# Patient Record
Sex: Female | Born: 1985 | Race: White | Hispanic: No | Marital: Single | Smoking: Never smoker
Health system: Southern US, Community
[De-identification: ages and names within clinical notes are randomized; demographics above are authoritative.]

## PROBLEM LIST (undated history)

## (undated) ENCOUNTER — Inpatient Hospital Stay (HOSPITAL_COMMUNITY): Payer: Self-pay

## (undated) DIAGNOSIS — G8929 Other chronic pain: Secondary | ICD-10-CM

## (undated) DIAGNOSIS — M5136 Other intervertebral disc degeneration, lumbar region: Secondary | ICD-10-CM

## (undated) DIAGNOSIS — M545 Low back pain, unspecified: Secondary | ICD-10-CM

## (undated) DIAGNOSIS — R519 Headache, unspecified: Secondary | ICD-10-CM

## (undated) DIAGNOSIS — M51369 Other intervertebral disc degeneration, lumbar region without mention of lumbar back pain or lower extremity pain: Secondary | ICD-10-CM

## (undated) DIAGNOSIS — E559 Vitamin D deficiency, unspecified: Secondary | ICD-10-CM

## (undated) DIAGNOSIS — E7800 Pure hypercholesterolemia, unspecified: Secondary | ICD-10-CM

## (undated) DIAGNOSIS — F419 Anxiety disorder, unspecified: Secondary | ICD-10-CM

## (undated) DIAGNOSIS — R51 Headache: Secondary | ICD-10-CM

## (undated) DIAGNOSIS — E669 Obesity, unspecified: Secondary | ICD-10-CM

## (undated) DIAGNOSIS — M129 Arthropathy, unspecified: Secondary | ICD-10-CM

## (undated) DIAGNOSIS — E282 Polycystic ovarian syndrome: Secondary | ICD-10-CM

## (undated) DIAGNOSIS — I10 Essential (primary) hypertension: Secondary | ICD-10-CM

## (undated) HISTORY — DX: Other intervertebral disc degeneration, lumbar region without mention of lumbar back pain or lower extremity pain: M51.369

## (undated) HISTORY — PX: NOSE SURGERY: SHX723

## (undated) HISTORY — DX: Arthropathy, unspecified: M12.9

## (undated) HISTORY — DX: Other intervertebral disc degeneration, lumbar region: M51.36

## (undated) HISTORY — DX: Anxiety disorder, unspecified: F41.9

## (undated) HISTORY — DX: Obesity, unspecified: E66.9

## (undated) HISTORY — DX: Polycystic ovarian syndrome: E28.2

## (undated) HISTORY — PX: ARTHROSCOPIC REPAIR ACL: SUR80

## (undated) HISTORY — DX: Vitamin D deficiency, unspecified: E55.9

## (undated) HISTORY — DX: Low back pain, unspecified: M54.50

## (undated) HISTORY — PX: KNEE SURGERY: SHX244

## (undated) HISTORY — PX: WISDOM TOOTH EXTRACTION: SHX21

## (undated) HISTORY — DX: Pure hypercholesterolemia, unspecified: E78.00

## (undated) HISTORY — PX: TUBAL LIGATION: SHX77

## (undated) HISTORY — DX: Other chronic pain: G89.29

---

## 2010-06-18 ENCOUNTER — Emergency Department (HOSPITAL_COMMUNITY)
Admission: EM | Admit: 2010-06-18 | Discharge: 2010-06-18 | Disposition: A | Discharge status: Home / Self Care | Payer: Medicaid Other | Attending: Emergency Medicine | Admitting: Emergency Medicine

## 2010-06-18 DIAGNOSIS — R209 Unspecified disturbances of skin sensation: Secondary | ICD-10-CM | POA: Insufficient documentation

## 2012-07-23 ENCOUNTER — Ambulatory Visit: Payer: Self-pay | Admitting: General Practice

## 2012-08-06 ENCOUNTER — Ambulatory Visit: Payer: Self-pay | Admitting: General Practice

## 2015-01-26 LAB — OB RESULTS CONSOLE HGB/HCT, BLOOD
HCT: 38 %
HEMOGLOBIN: 13 g/dL

## 2015-01-26 LAB — OB RESULTS CONSOLE RPR: RPR: NONREACTIVE

## 2015-01-26 LAB — OB RESULTS CONSOLE HEPATITIS B SURFACE ANTIGEN: HEP B S AG: NEGATIVE

## 2015-01-26 LAB — OB RESULTS CONSOLE GC/CHLAMYDIA
Chlamydia: NEGATIVE
Gonorrhea: NEGATIVE

## 2015-01-26 LAB — OB RESULTS CONSOLE ANTIBODY SCREEN: ANTIBODY SCREEN: NEGATIVE

## 2015-01-26 LAB — OB RESULTS CONSOLE ABO/RH: RH TYPE: POSITIVE

## 2015-01-26 LAB — OB RESULTS CONSOLE RUBELLA ANTIBODY, IGM: Rubella: IMMUNE

## 2015-01-26 LAB — OB RESULTS CONSOLE HIV ANTIBODY (ROUTINE TESTING): HIV: NONREACTIVE

## 2015-01-26 LAB — OB RESULTS CONSOLE TB SKIN TEST: CHL TB SkinTest: NEGATIVE

## 2015-01-27 DIAGNOSIS — Z302 Encounter for sterilization: Secondary | ICD-10-CM | POA: Insufficient documentation

## 2015-01-27 DIAGNOSIS — IMO0002 Reserved for concepts with insufficient information to code with codable children: Secondary | ICD-10-CM | POA: Insufficient documentation

## 2015-03-01 ENCOUNTER — Encounter: Payer: Self-pay | Admitting: *Deleted

## 2015-03-01 ENCOUNTER — Encounter: Payer: Self-pay | Admitting: Advanced Practice Midwife

## 2015-03-01 ENCOUNTER — Ambulatory Visit (INDEPENDENT_AMBULATORY_CARE_PROVIDER_SITE_OTHER): Payer: Medicaid Other | Admitting: Advanced Practice Midwife

## 2015-03-01 VITALS — BP 126/84 | HR 88 | Ht 66.0 in | Wt 224.0 lb

## 2015-03-01 DIAGNOSIS — Z3491 Encounter for supervision of normal pregnancy, unspecified, first trimester: Secondary | ICD-10-CM

## 2015-03-01 DIAGNOSIS — O0990 Supervision of high risk pregnancy, unspecified, unspecified trimester: Secondary | ICD-10-CM | POA: Insufficient documentation

## 2015-03-01 DIAGNOSIS — Z3490 Encounter for supervision of normal pregnancy, unspecified, unspecified trimester: Secondary | ICD-10-CM

## 2015-03-01 DIAGNOSIS — O34219 Maternal care for unspecified type scar from previous cesarean delivery: Secondary | ICD-10-CM | POA: Insufficient documentation

## 2015-03-01 DIAGNOSIS — Z3481 Encounter for supervision of other normal pregnancy, first trimester: Secondary | ICD-10-CM

## 2015-03-01 LAB — PAP, THINPREP RFLX HPV
HPV DNA: NOT DETECTED
Pap: NEGATIVE

## 2015-03-01 NOTE — Patient Instructions (Signed)
First Trimester of Pregnancy The first trimester of pregnancy is from week 1 until the end of week 12 (months 1 through 3). A week after a sperm fertilizes an egg, the egg will implant on the wall of the uterus. This embryo will begin to develop into a baby. Genes from you and your partner are forming the baby. The female genes determine whether the baby is a boy or a girl. At 6-8 weeks, the eyes and face are formed, and the heartbeat can be seen on ultrasound. At the end of 12 weeks, all the baby's organs are formed.  Now that you are pregnant, you will want to do everything you can to have a healthy baby. Two of the most important things are to get good prenatal care and to follow your health care provider's instructions. Prenatal care is all the medical care you receive before the baby's birth. This care will help prevent, find, and treat any problems during the pregnancy and childbirth. BODY CHANGES Your body goes through many changes during pregnancy. The changes vary from woman to woman.   You may gain or lose a couple of pounds at first.  You may feel sick to your stomach (nauseous) and throw up (vomit). If the vomiting is uncontrollable, call your health care provider.  You may tire easily.  You may develop headaches that can be relieved by medicines approved by your health care provider.  You may urinate more often. Painful urination may mean you have a bladder infection.  You may develop heartburn as a result of your pregnancy.  You may develop constipation because certain hormones are causing the muscles that push waste through your intestines to slow down.  You may develop hemorrhoids or swollen, bulging veins (varicose veins).  Your breasts may begin to grow larger and become tender. Your nipples may stick out more, and the tissue that surrounds them (areola) may become darker.  Your gums may bleed and may be sensitive to brushing and flossing.  Dark spots or blotches (chloasma,  mask of pregnancy) may develop on your face. This will likely fade after the baby is born.  Your menstrual periods will stop.  You may have a loss of appetite.  You may develop cravings for certain kinds of food.  You may have changes in your emotions from day to day, such as being excited to be pregnant or being concerned that something may go wrong with the pregnancy and baby.  You may have more vivid and strange dreams.  You may have changes in your hair. These can include thickening of your hair, rapid growth, and changes in texture. Some women also have hair loss during or after pregnancy, or hair that feels dry or thin. Your hair will most likely return to normal after your baby is born. WHAT TO EXPECT AT YOUR PRENATAL VISITS During a routine prenatal visit:  You will be weighed to make sure you and the baby are growing normally.  Your blood pressure will be taken.  Your abdomen will be measured to track your baby's growth.  The fetal heartbeat will be listened to starting around week 10 or 12 of your pregnancy.  Test results from any previous visits will be discussed. Your health care provider may ask you:  How you are feeling.  If you are feeling the baby move.  If you have had any abnormal symptoms, such as leaking fluid, bleeding, severe headaches, or abdominal cramping.  If you are using any tobacco products,   including cigarettes, chewing tobacco, and electronic cigarettes.  If you have any questions. Other tests that may be performed during your first trimester include:  Blood tests to find your blood type and to check for the presence of any previous infections. They will also be used to check for low iron levels (anemia) and Rh antibodies. Later in the pregnancy, blood tests for diabetes will be done along with other tests if problems develop.  Urine tests to check for infections, diabetes, or protein in the urine.  An ultrasound to confirm the proper growth  and development of the baby.  An amniocentesis to check for possible genetic problems.  Fetal screens for spina bifida and Down syndrome.  You may need other tests to make sure you and the baby are doing well.  HIV (human immunodeficiency virus) testing. Routine prenatal testing includes screening for HIV, unless you choose not to have this test. HOME CARE INSTRUCTIONS  Medicines  Follow your health care provider's instructions regarding medicine use. Specific medicines may be either safe or unsafe to take during pregnancy.  Take your prenatal vitamins as directed.  If you develop constipation, try taking a stool softener if your health care provider approves. Diet  Eat regular, well-balanced meals. Choose a variety of foods, such as meat or vegetable-based protein, fish, milk and low-fat dairy products, vegetables, fruits, and whole grain breads and cereals. Your health care provider will help you determine the amount of weight gain that is right for you.  Avoid raw meat and uncooked cheese. These carry germs that can cause birth defects in the baby.  Eating four or five small meals rather than three large meals a day may help relieve nausea and vomiting. If you start to feel nauseous, eating a few soda crackers can be helpful. Drinking liquids between meals instead of during meals also seems to help nausea and vomiting.  If you develop constipation, eat more high-fiber foods, such as fresh vegetables or fruit and whole grains. Drink enough fluids to keep your urine clear or pale yellow. Activity and Exercise  Exercise only as directed by your health care provider. Exercising will help you:  Control your weight.  Stay in shape.  Be prepared for labor and delivery.  Experiencing pain or cramping in the lower abdomen or low back is a good sign that you should stop exercising. Check with your health care provider before continuing normal exercises.  Try to avoid standing for long  periods of time. Move your legs often if you must stand in one place for a long time.  Avoid heavy lifting.  Wear low-heeled shoes, and practice good posture.  You may continue to have sex unless your health care provider directs you otherwise. Relief of Pain or Discomfort  Wear a good support bra for breast tenderness.   Take warm sitz baths to soothe any pain or discomfort caused by hemorrhoids. Use hemorrhoid cream if your health care provider approves.   Rest with your legs elevated if you have leg cramps or low back pain.  If you develop varicose veins in your legs, wear support hose. Elevate your feet for 15 minutes, 3-4 times a day. Limit salt in your diet. Prenatal Care  Schedule your prenatal visits by the twelfth week of pregnancy. They are usually scheduled monthly at first, then more often in the last 2 months before delivery.  Write down your questions. Take them to your prenatal visits.  Keep all your prenatal visits as directed by your   health care provider. Safety  Wear your seat belt at all times when driving.  Make a list of emergency phone numbers, including numbers for family, friends, the hospital, and police and fire departments. General Tips  Ask your health care provider for a referral to a local prenatal education class. Begin classes no later than at the beginning of month 6 of your pregnancy.  Ask for help if you have counseling or nutritional needs during pregnancy. Your health care provider can offer advice or refer you to specialists for help with various needs.  Do not use hot tubs, steam rooms, or saunas.  Do not douche or use tampons or scented sanitary pads.  Do not cross your legs for long periods of time.  Avoid cat litter boxes and soil used by cats. These carry germs that can cause birth defects in the baby and possibly loss of the fetus by miscarriage or stillbirth.  Avoid all smoking, herbs, alcohol, and medicines not prescribed by  your health care provider. Chemicals in these affect the formation and growth of the baby.  Do not use any tobacco products, including cigarettes, chewing tobacco, and electronic cigarettes. If you need help quitting, ask your health care provider. You may receive counseling support and other resources to help you quit.  Schedule a dentist appointment. At home, brush your teeth with a soft toothbrush and be gentle when you floss. SEEK MEDICAL CARE IF:   You have dizziness.  You have mild pelvic cramps, pelvic pressure, or nagging pain in the abdominal area.  You have persistent nausea, vomiting, or diarrhea.  You have a bad smelling vaginal discharge.  You have pain with urination.  You notice increased swelling in your face, hands, legs, or ankles. SEEK IMMEDIATE MEDICAL CARE IF:   You have a fever.  You are leaking fluid from your vagina.  You have spotting or bleeding from your vagina.  You have severe abdominal cramping or pain.  You have rapid weight gain or loss.  You vomit blood or material that looks like coffee grounds.  You are exposed to German measles and have never had them.  You are exposed to fifth disease or chickenpox.  You develop a severe headache.  You have shortness of breath.  You have any kind of trauma, such as from a fall or a car accident.   This information is not intended to replace advice given to you by your health care provider. Make sure you discuss any questions you have with your health care provider.   Document Released: 12/13/2000 Document Revised: 01/09/2014 Document Reviewed: 10/29/2012 Elsevier Interactive Patient Education 2016 [employer]  

## 2015-03-01 NOTE — Progress Notes (Signed)
New OB, see SmartSet  Subjective:    Sandra Fuller is a G3P1011 [redacted]w[redacted]d being seen today for her first obstetrical visit.  Her obstetrical history is significant for previous C/S. Patient does intend to breast feed. Pregnancy history fully reviewed.  Patient reports no complaints.  Filed Vitals:   03/01/15 1028 03/01/15 1031  BP: 126/84   Pulse: 88   Height:   (1.676 m)  Weight: 224 lb (101.606 kg)     HISTORY: OB History  Gravida Para Term Preterm AB SAB TAB Ectopic Multiple Living  # Outcome Date GA Lbr Len/2nd Weight Sex Delivery Anes PTL Lv  3 Current           2 Term 10/31/07   6 lb (2.722 kg) F CS-LTranv   Y     Complications: Failure to Progress in First Stage,Two vessel umbilical cord, antepartum, fetus 2  1 SAB              History reviewed. No pertinent past medical history. Past Surgical History  Procedure Laterality Date  . Cesarean section    . Arthroscopic repair acl Bilateral   . Nose surgery      broken nose  . Wisdom tooth extraction     Family History  Problem Relation Age of Onset  . Hypertension Mother   . Hypertension Father   . Heart disease Father   . Cancer - Lung Paternal Grandmother   . Diabetes Mother     gestational     Exam    Uterus:     Pelvic Exam:    Perineum: Exam deferred per pt request, just had one at other practice   Vulva: deferred   Vagina:  deferred   pH:    Cervix: deferred   Adnexa: not evaluated   Bony Pelvis: gynecoid  System: Breast:  normal appearance, no masses or tenderness   Skin: normal coloration and turgor, no rashes    Neurologic: oriented, grossly non-focal   Extremities: normal strength, tone, and muscle mass   HEENT neck supple with midline trachea   Mouth/Teeth mucous membranes moist, pharynx normal without lesions   Neck supple   Cardiovascular: regular rate and rhythm   Respiratory:  appears well, vitals normal, no respiratory distress, acyanotic, normal RR, ear and  throat exam is normal   Abdomen: soft, non-tender; bowel sounds normal; no masses,  no organomegaly   Urinary: n/a      Assessment:    Pregnancy: G3P1011 Patient Active Problem List   Diagnosis Date Noted  . Normal pregnancy, antepartum 03/01/2015  . History of cesarean delivery, currently pregnant 03/01/2015        Plan:     Initial labs drawn. Prenatal vitamins. Problem list reviewed and updated. Genetic Screening discussed Integrated Screen: ordered.  Ultrasound discussed; fetal survey: ordered.  Follow up in 4 weeks. 50% of 30 min visit spent on counseling and coordination of care.   Routines reviewed Welcomed to practice OK with students   [facility] 03/01/2015

## 2015-03-10 ENCOUNTER — Encounter (HOSPITAL_COMMUNITY): Payer: Self-pay | Admitting: Obstetrics & Gynecology

## 2015-03-18 ENCOUNTER — Ambulatory Visit (HOSPITAL_COMMUNITY)
Admission: RE | Admit: 2015-03-18 | Discharge: 2015-03-18 | Disposition: A | Discharge status: Home / Self Care | Payer: Medicaid Other | Source: Ambulatory Visit | Attending: Advanced Practice Midwife | Admitting: Advanced Practice Midwife

## 2015-03-18 ENCOUNTER — Other Ambulatory Visit: Payer: Self-pay | Admitting: Advanced Practice Midwife

## 2015-03-18 ENCOUNTER — Other Ambulatory Visit (HOSPITAL_COMMUNITY): Payer: Medicaid Other

## 2015-03-18 VITALS — BP 125/76 | HR 97 | Wt 224.0 lb

## 2015-03-18 DIAGNOSIS — Z369 Encounter for antenatal screening, unspecified: Secondary | ICD-10-CM

## 2015-03-18 DIAGNOSIS — O34219 Maternal care for unspecified type scar from previous cesarean delivery: Secondary | ICD-10-CM | POA: Diagnosis not present

## 2015-03-18 DIAGNOSIS — Z3490 Encounter for supervision of normal pregnancy, unspecified, unspecified trimester: Secondary | ICD-10-CM

## 2015-03-18 DIAGNOSIS — Z3A13 13 weeks gestation of pregnancy: Secondary | ICD-10-CM | POA: Diagnosis not present

## 2015-03-18 DIAGNOSIS — O99331 Smoking (tobacco) complicating pregnancy, first trimester: Secondary | ICD-10-CM | POA: Diagnosis not present

## 2015-03-18 DIAGNOSIS — Z36 Encounter for antenatal screening of mother: Secondary | ICD-10-CM | POA: Insufficient documentation

## 2015-03-18 DIAGNOSIS — Z3491 Encounter for supervision of normal pregnancy, unspecified, first trimester: Secondary | ICD-10-CM

## 2015-03-24 ENCOUNTER — Encounter: Payer: Self-pay | Admitting: *Deleted

## 2015-03-24 DIAGNOSIS — Z3490 Encounter for supervision of normal pregnancy, unspecified, unspecified trimester: Secondary | ICD-10-CM

## 2015-03-26 ENCOUNTER — Encounter: Payer: Self-pay | Admitting: *Deleted

## 2015-03-26 ENCOUNTER — Other Ambulatory Visit (HOSPITAL_COMMUNITY): Payer: Self-pay

## 2015-03-26 ENCOUNTER — Ambulatory Visit (INDEPENDENT_AMBULATORY_CARE_PROVIDER_SITE_OTHER): Payer: Medicaid Other | Admitting: Advanced Practice Midwife

## 2015-03-26 VITALS — BP 130/84 | HR 98 | Wt 228.0 lb

## 2015-03-26 DIAGNOSIS — Z1389 Encounter for screening for other disorder: Secondary | ICD-10-CM

## 2015-03-26 DIAGNOSIS — Z36 Encounter for antenatal screening of mother: Secondary | ICD-10-CM

## 2015-03-26 DIAGNOSIS — Z3482 Encounter for supervision of other normal pregnancy, second trimester: Secondary | ICD-10-CM

## 2015-03-26 DIAGNOSIS — R519 Headache, unspecified: Secondary | ICD-10-CM

## 2015-03-26 DIAGNOSIS — O26892 Other specified pregnancy related conditions, second trimester: Secondary | ICD-10-CM

## 2015-03-26 DIAGNOSIS — O34219 Maternal care for unspecified type scar from previous cesarean delivery: Secondary | ICD-10-CM

## 2015-03-26 DIAGNOSIS — R51 Headache: Secondary | ICD-10-CM

## 2015-03-26 DIAGNOSIS — Z3490 Encounter for supervision of normal pregnancy, unspecified, unspecified trimester: Secondary | ICD-10-CM

## 2015-03-26 MED ORDER — BUTALBITAL-APAP-CAFFEINE 50-325-40 MG PO CAPS: 1.0000 | ORAL_CAPSULE | Freq: Four times a day (QID) | ORAL | Status: DC | PRN

## 2015-03-26 MED ORDER — IBUPROFEN 600 MG PO TABS: 600.0000 mg | ORAL_TABLET | Freq: Four times a day (QID) | ORAL | Status: DC | PRN

## 2015-03-26 NOTE — Progress Notes (Signed)
Subjective:  Sandra Fuller is a 30 y.o. G3P1011 at 4965w4d being seen today for ongoing prenatal care.  She is currently monitored for the following issues for this low-risk pregnancy and has Normal pregnancy, antepartum and History of cesarean delivery, currently pregnant on her problem list.  Patient reports frontal headache that does not resolve w/ Tylenol, Excedrin or caffeine. Had similar HA's frequently in last pregnancy.  Contractions: Not present. Vag. Bleeding: None.  Movement: Absent. Denies leaking of fluid.   The following portions of the patient's history were reviewed and updated as appropriate: allergies, current medications, past family history, past medical history, past social history, past surgical history and problem list. Problem list updated.  Objective:   Filed Vitals:   03/26/15 0836  BP: 130/84  Pulse: 98  Weight: 228 lb (103.42 kg)    Fetal Status: Fetal Heart Rate (bpm): 165 Fundal Height: 14 cm Movement: Absent     General:  Alert, oriented and cooperative. Patient is in no acute distress.  Skin: Skin is warm and dry. No rash noted.   Cardiovascular: Normal heart rate noted  Respiratory: Normal respiratory effort, no problems with respiration noted  Abdomen: Soft, gravid, appropriate for gestational age. Pain/Pressure: Absent     Pelvic: Vag. Bleeding: None Vag D/C Character: Thin   Cervical exam deferred        Extremities: Normal range of motion.  Edema: None  Mental Status: Normal mood and affect. Normal behavior. Normal judgment and thought content.   Urinalysis: Urine Protein: Trace Urine Glucose: Negative  Assessment and Plan:  Pregnancy: G3P1011 at 6065w4d  1. Normal pregnancy, antepartum  - US MFM OB COMP + 14 WK; Future  2. Encounter for routine screening for malformation using ultrasonics  - US MFM OB COMP + 14 WK; Future  3. Previous cesarean delivery affecting pregnancy, antepartum  - US MFM OB COMP + 14 WK; Future  Preterm labor  symptoms and general obstetric precautions including but not limited to vaginal bleeding, contractions, leaking of fluid and fetal movement were reviewed in detail with the patient. Please refer to After Visit Summary for other counseling recommendations.  Return in about 4 weeks (around 04/23/2015).   Dorathy KinsmanVirginia Gelene Recktenwald, CNM

## 2015-03-26 NOTE — Patient Instructions (Signed)

## 2015-04-01 ENCOUNTER — Encounter: Payer: Medicaid Other | Admitting: Obstetrics & Gynecology

## 2015-04-22 ENCOUNTER — Encounter: Payer: Medicaid Other | Admitting: Obstetrics & Gynecology

## 2015-04-23 ENCOUNTER — Ambulatory Visit (HOSPITAL_COMMUNITY)
Admission: RE | Admit: 2015-04-23 | Discharge: 2015-04-23 | Disposition: A | Discharge status: Home / Self Care | Payer: Medicaid Other | Source: Ambulatory Visit | Attending: Advanced Practice Midwife | Admitting: Advanced Practice Midwife

## 2015-04-23 ENCOUNTER — Other Ambulatory Visit: Payer: Self-pay | Admitting: Advanced Practice Midwife

## 2015-04-23 DIAGNOSIS — Z3A18 18 weeks gestation of pregnancy: Secondary | ICD-10-CM | POA: Diagnosis not present

## 2015-04-23 DIAGNOSIS — Z1389 Encounter for screening for other disorder: Secondary | ICD-10-CM

## 2015-04-23 DIAGNOSIS — O34219 Maternal care for unspecified type scar from previous cesarean delivery: Secondary | ICD-10-CM

## 2015-04-23 DIAGNOSIS — Z3490 Encounter for supervision of normal pregnancy, unspecified, unspecified trimester: Secondary | ICD-10-CM

## 2015-04-23 DIAGNOSIS — Z36 Encounter for antenatal screening of mother: Secondary | ICD-10-CM | POA: Insufficient documentation

## 2015-04-28 ENCOUNTER — Ambulatory Visit (INDEPENDENT_AMBULATORY_CARE_PROVIDER_SITE_OTHER): Payer: Medicaid Other | Admitting: Obstetrics & Gynecology

## 2015-04-28 VITALS — BP 132/82 | HR 99 | Wt 229.0 lb

## 2015-04-28 DIAGNOSIS — Z36 Encounter for antenatal screening of mother: Secondary | ICD-10-CM

## 2015-04-28 DIAGNOSIS — Z3402 Encounter for supervision of normal first pregnancy, second trimester: Secondary | ICD-10-CM

## 2015-04-28 DIAGNOSIS — Z0489 Encounter for examination and observation for other specified reasons: Secondary | ICD-10-CM

## 2015-04-28 DIAGNOSIS — IMO0002 Reserved for concepts with insufficient information to code with codable children: Secondary | ICD-10-CM

## 2015-04-28 DIAGNOSIS — Z3490 Encounter for supervision of normal pregnancy, unspecified, unspecified trimester: Secondary | ICD-10-CM

## 2015-04-28 NOTE — Progress Notes (Signed)
Subjective:  Sandra Fuller is a [age] y.o. G3P1011 at 5982w2d being seen today for ongoing prenatal care.  She is currently monitored for the following issues for this low-risk pregnancy and has Normal pregnancy, antepartum and History of cesarean delivery, currently pregnant on her problem list.  Patient reports tooth ache, needs tooth to be pulled. Wants to know if this safe now, also requests letter to dentist.  Contractions: Not present. Vag. Bleeding: None.  Movement: Absent. Denies leaking of fluid.   The following portions of the patient's history were reviewed and updated as appropriate: allergies, current medications, past family history, past medical history, past social history, past surgical history and problem list. Problem list updated.  Objective:   Filed Vitals:   04/28/15 0945  BP: 132/82  Pulse: 99  Weight: 229 lb (103.874 kg)    Fetal Status: Fetal Heart Rate (bpm): 154 Fundal Height: 19 cm Movement: Absent     General:  Alert, oriented and cooperative. Patient is in no acute distress.  Skin: Skin is warm and dry. No rash noted.   Cardiovascular: Normal heart rate noted  Respiratory: Normal respiratory effort, no problems with respiration noted  Abdomen: Soft, gravid, appropriate for gestational age. Pain/Pressure: Absent     Pelvic: Vag. Bleeding: None Vag D/C Character: Thick  Cervical exam deferred        Extremities: Normal range of motion.  Edema: Trace  Mental Status: Normal mood and affect. Normal behavior. Normal judgment and thought content.   Urinalysis: Urine Protein: Trace Urine Glucose: Negative  Assessment and Plan:  Pregnancy: G3P1011 at 4082w2d  1. Evaluate anatomy not seen on prior sonogram Limited anatomy scan at 18 weeks, rescan at 24 weeks ordered - US MFM OB FOLLOW UP; Future  2. Normal pregnancy, antepartum Normal first screen, AFP screen today - Alpha fetoprotein, maternal Dental letter given; patient reassured about safety of dental  procedures during pregnancy. Routine obstetric precautions reviewed. Please refer to After Visit Summary for other counseling recommendations.  Return in about 4 weeks (around 05/26/2015) for OB Visit.   Tereso NewcomerUgonna A Anyanwu, MD

## 2015-04-28 NOTE — Patient Instructions (Signed)
Return to clinic for any scheduled appointments or obstetric concerns, or go to MAU for evaluation  

## 2015-04-29 ENCOUNTER — Encounter: Payer: Medicaid Other | Admitting: Obstetrics & Gynecology

## 2015-04-29 LAB — ALPHA FETOPROTEIN, MATERNAL
AFP: 64.8 ng/mL
Curr Gest Age: 19.3 weeks
MoM for AFP: 1.64
Open Spina bifida: NEGATIVE
Osb Risk: 1:1870 {titer}

## 2015-05-02 DIAGNOSIS — O09899 Supervision of other high risk pregnancies, unspecified trimester: Secondary | ICD-10-CM | POA: Insufficient documentation

## 2015-05-08 ENCOUNTER — Inpatient Hospital Stay (HOSPITAL_COMMUNITY)
Admission: AD | Admit: 2015-05-08 | Discharge: 2015-05-08 | Disposition: A | Discharge status: Home / Self Care | Payer: Medicaid Other | Source: Ambulatory Visit | Attending: Obstetrics & Gynecology | Admitting: Obstetrics & Gynecology

## 2015-05-08 ENCOUNTER — Encounter (HOSPITAL_COMMUNITY): Payer: Self-pay | Admitting: *Deleted

## 2015-05-08 DIAGNOSIS — O99352 Diseases of the nervous system complicating pregnancy, second trimester: Secondary | ICD-10-CM | POA: Diagnosis not present

## 2015-05-08 DIAGNOSIS — M549 Dorsalgia, unspecified: Secondary | ICD-10-CM | POA: Diagnosis present

## 2015-05-08 DIAGNOSIS — O9989 Other specified diseases and conditions complicating pregnancy, childbirth and the puerperium: Secondary | ICD-10-CM

## 2015-05-08 DIAGNOSIS — O26892 Other specified pregnancy related conditions, second trimester: Secondary | ICD-10-CM

## 2015-05-08 DIAGNOSIS — Z3490 Encounter for supervision of normal pregnancy, unspecified, unspecified trimester: Secondary | ICD-10-CM

## 2015-05-08 DIAGNOSIS — Z3A20 20 weeks gestation of pregnancy: Secondary | ICD-10-CM | POA: Insufficient documentation

## 2015-05-08 DIAGNOSIS — G43909 Migraine, unspecified, not intractable, without status migrainosus: Secondary | ICD-10-CM | POA: Diagnosis not present

## 2015-05-08 HISTORY — DX: Headache, unspecified: R51.9

## 2015-05-08 HISTORY — DX: Headache: R51

## 2015-05-08 LAB — URINALYSIS, ROUTINE W REFLEX MICROSCOPIC
Bilirubin Urine: NEGATIVE
Glucose, UA: NEGATIVE mg/dL
HGB URINE DIPSTICK: NEGATIVE
Ketones, ur: 15 mg/dL — AB
LEUKOCYTES UA: NEGATIVE
NITRITE: NEGATIVE
PROTEIN: NEGATIVE mg/dL
Specific Gravity, Urine: 1.02 (ref 1.005–1.030)
pH: 6 (ref 5.0–8.0)

## 2015-05-08 MED ORDER — CYCLOBENZAPRINE HCL 10 MG PO TABS
10.0000 mg | ORAL_TABLET | Freq: Once | ORAL | Status: AC
  Administered 2015-05-08: 10 mg via ORAL
  Filled 2015-05-08: qty 1

## 2015-05-08 NOTE — MAU Provider Note (Signed)
History     CSN: [id number]  Arrival date and time: 05/08/15 4098   First Provider Initiated Contact with Patient 05/08/15 1903      Chief Complaint  Patient presents with  . Back Pain  . Decreased Fetal Movement   HPI .Sandra Fuller 29 y.o. [redacted]w[redacted]d Comes to MAU with back pain just below left scapula and has not felt the baby move since 11 am.  Called and was told she probably had decreased fetal movement.  Has had no vaginal bleeding and no leaking.  Lifted a large lab this week and wonders if that is causing her back pain.  Has taken tylenol but it has not completely resolved.  Last pregnancy was 8 years ago and she does not remember what is normal.  OB History    Gravida Para Term Preterm AB TAB SAB Ectopic Multiple Living   Past Medical History  Diagnosis Date  . Headache     Migraines    Past Surgical History  Procedure Laterality Date  . Cesarean section    . Arthroscopic repair acl Bilateral   . Nose surgery      broken nose  . Wisdom tooth extraction      Family History  Problem Relation Age of Onset  . Hypertension Mother   . Hypertension Father   . Heart disease Father   . Cancer - Lung Paternal Grandmother   . Diabetes Mother     gestational    Social History  Substance Use Topics  . Smoking status: Never Smoker   . Smokeless tobacco: Never Used  . Alcohol Use: 0.0 oz/week    0 Standard drinks or equivalent per week     Comment: occassionally    Allergies: No Known Allergies  Prescriptions prior to admission  Medication Sig Dispense Refill Last Dose  . acetaminophen (TYLENOL) 325 MG tablet Take by mouth.   Taking  . Butalbital-APAP-Caffeine 50-325-40 MG capsule Take 1-2 capsules by mouth every 6 (six) hours as needed for headache. 30 capsule 1   . ibuprofen (ADVIL,MOTRIN) 600 MG tablet Take 1 tablet (600 mg total) by mouth every 6 (six) hours as needed for moderate pain. Do not use after [redacted] weeks gestation. 30 tablet 1    . ondansetron (ZOFRAN) 4 MG tablet Take by mouth.   Taking  . Prenatal Vit-Fe Fumarate-FA (PRENATAL VITAMINS) 28-0.8 MG TABS Take by mouth.   Taking    Review of Systems  Constitutional: Negative for fever.  Gastrointestinal: Negative for nausea, vomiting and abdominal pain.  Genitourinary:       No vaginal discharge. No vaginal bleeding. No dysuria.  Musculoskeletal: Positive for back pain.   Physical Exam   Blood pressure 126/77, pulse 105, temperature 98 F (36.7 C), temperature source Axillary, resp. rate 16, last menstrual period 12/14/2014.  Physical Exam  Nursing note and vitals reviewed. Constitutional: She is oriented to person, place, and time. She appears well-developed and well-nourished.  HENT:  Head: Normocephalic.  Eyes: EOM are normal.  Neck: Neck supple.  GI: Soft. There is no tenderness.  Musculoskeletal: Normal range of motion.  Has back pain just below her left scapula.  With massage, client can pinpoint the area.    Neurological: She is alert and oriented to person, place, and time.  Skin: Skin [id number] dry.  Psychiatric: She has a normal mood and affect.    MAU Course  Procedures Results for orders placed or performed during the hospital encounter of 05/08/15 (from the past 24 hour(s))  Urinalysis, Routine w reflex microscopic (not [facility])     Status: Abnormal   Collection Time: 05/08/15  6:20 PM  Result Value Ref Range   Color, Urine YELLOW YELLOW   APPearance CLEAR CLEAR   Specific Gravity, Urine 1.020 1.005 - 1.030   pH 6.0 5.0 - 8.0   Glucose, UA NEGATIVE NEGATIVE mg/dL   Hgb urine dipstick NEGATIVE NEGATIVE   Bilirubin Urine NEGATIVE NEGATIVE   Ketones, ur 15 (A) NEGATIVE mg/dL   Protein, ur NEGATIVE NEGATIVE mg/dL   Nitrite NEGATIVE NEGATIVE   Leukocytes, UA NEGATIVE NEGATIVE    MDM Musculoskeletal pain likely from lifting her dog.  Advised ice and deep tissue massage to get the area to relax.  Given one Flexeril in MAU since area  is unrelieved with tylenol.  Has more flexeril at home she can use if it is helpful.  Encouraged to use ice to the area.  Discussed normal fetal movement for 20 weeks.  Advised that the movement is not decreased at this time, but that the movements are periodic for the next 4-6 weeks.  Assessment and Plan  Musculoskeletal back pain  Plan Items listed above discussed with client. Avoid lifting heavy items. Keep your appointments in the office. Call your doctor if your symptoms worsen.  Cynthia Stainback 05/08/2015, 7:24 PM

## 2015-05-08 NOTE — MAU Note (Addendum)
Decreased FM since last night. Back pain X 2 hours. Points to L flank. States she had this pain before but has only lasted about 20 mins. States it feels like someone is stabbing her in the back. Tried tylenol, heat and ice which usually helps, but not this time.

## 2015-05-08 NOTE — Discharge Instructions (Signed)
Use Flexeril as needed. Call your doctor if your symptoms worsen.

## 2015-05-26 ENCOUNTER — Ambulatory Visit (INDEPENDENT_AMBULATORY_CARE_PROVIDER_SITE_OTHER): Payer: Medicaid Other | Admitting: Obstetrics & Gynecology

## 2015-05-26 VITALS — BP 122/66 | HR 96 | Wt 230.0 lb

## 2015-05-26 DIAGNOSIS — O34219 Maternal care for unspecified type scar from previous cesarean delivery: Secondary | ICD-10-CM

## 2015-05-26 DIAGNOSIS — Z3482 Encounter for supervision of other normal pregnancy, second trimester: Secondary | ICD-10-CM

## 2015-05-26 DIAGNOSIS — E669 Obesity, unspecified: Secondary | ICD-10-CM

## 2015-05-26 DIAGNOSIS — Z3490 Encounter for supervision of normal pregnancy, unspecified, unspecified trimester: Secondary | ICD-10-CM

## 2015-05-26 DIAGNOSIS — O99212 Obesity complicating pregnancy, second trimester: Secondary | ICD-10-CM

## 2015-05-26 DIAGNOSIS — O99210 Obesity complicating pregnancy, unspecified trimester: Secondary | ICD-10-CM | POA: Insufficient documentation

## 2015-05-26 NOTE — Progress Notes (Signed)
Increase in leg cramps 

## 2015-05-26 NOTE — Progress Notes (Signed)
Subjective:  Sandra Fuller is a 30 y.o.  SW 313P1011 (817 yo daughter at home)  at 5971w2d being seen today for ongoing prenatal care.  She is currently monitored for the following issues for this low-risk pregnancy and has Normal pregnancy, antepartum; History of cesarean delivery, currently pregnant; Two vessel umbilical cord, antepartum; and Obesity in pregnancy, antepartum on her problem list.  Patient reports no complaints.  Contractions: Not present. Vag. Bleeding: None.  Movement: Present. Denies leaking of fluid.   The following portions of the patient's history were reviewed and updated as appropriate: allergies, current medications, past family history, past medical history, past social history, past surgical history and problem list. Problem list updated.  Objective:   Filed Vitals:   05/26/15 1035  BP: 122/66  Pulse: 96  Weight: 230 lb (104.327 kg)    Fetal Status: Fetal Heart Rate (bpm): 166 Fundal Height: 25 cm Movement: Present     General:  Alert, oriented and cooperative. Patient is in no acute distress.  Skin: Skin is warm and dry. No rash noted.   Cardiovascular: Normal heart rate noted  Respiratory: Normal respiratory effort, no problems with respiration noted  Abdomen: Soft, gravid, appropriate for gestational age. Pain/Pressure: Absent     Pelvic: Vag. Bleeding: None Vag D/C Character: Thin   Cervical exam deferred        Extremities: Normal range of motion.  Edema: Trace  Mental Status: Normal mood and affect. Normal behavior. Normal judgment and thought content.   Urinalysis: Urine Protein: Trace Urine Glucose: Negative  Assessment and Plan:  Pregnancy: G3P1011 at 7771w2d  1. History of cesarean delivery, currently pregnant - She wants a RLTCS, possbile BTL. I sent [place] an email to schedule this at [redacted] weeks EGA.  2. Normal pregnancy, antepartum   3. Obesity in pregnancy, antepartum, second trimester - rec less than 20 # weight gain in pregnancy  Preterm  labor symptoms and general obstetric precautions including but not limited to vaginal bleeding, contractions, leaking of fluid and fetal movement were reviewed in detail with the patient. Please refer to After Visit Summary for other counseling recommendations.   Return in about 4 weeks (around 06/23/2015) for glucola.   Allie BossierMyra C Jackelynn Hosie, MD

## 2015-05-27 ENCOUNTER — Ambulatory Visit (HOSPITAL_COMMUNITY): Payer: Medicaid Other

## 2015-05-27 ENCOUNTER — Ambulatory Visit (HOSPITAL_COMMUNITY)
Admission: RE | Admit: 2015-05-27 | Discharge: 2015-05-27 | Disposition: A | Discharge status: Home / Self Care | Payer: Medicaid Other | Source: Ambulatory Visit | Attending: Obstetrics & Gynecology | Admitting: Obstetrics & Gynecology

## 2015-05-27 ENCOUNTER — Encounter: Payer: Self-pay | Admitting: Obstetrics & Gynecology

## 2015-05-27 DIAGNOSIS — Z36 Encounter for antenatal screening of mother: Secondary | ICD-10-CM | POA: Insufficient documentation

## 2015-05-27 DIAGNOSIS — O34219 Maternal care for unspecified type scar from previous cesarean delivery: Secondary | ICD-10-CM | POA: Insufficient documentation

## 2015-05-27 DIAGNOSIS — Z3A23 23 weeks gestation of pregnancy: Secondary | ICD-10-CM | POA: Diagnosis not present

## 2015-05-27 DIAGNOSIS — Z0489 Encounter for examination and observation for other specified reasons: Secondary | ICD-10-CM

## 2015-05-27 DIAGNOSIS — IMO0002 Reserved for concepts with insufficient information to code with codable children: Secondary | ICD-10-CM

## 2015-05-27 DIAGNOSIS — O359XX0 Maternal care for (suspected) fetal abnormality and damage, unspecified, not applicable or unspecified: Secondary | ICD-10-CM | POA: Insufficient documentation

## 2015-06-01 ENCOUNTER — Telehealth: Payer: Self-pay | Admitting: *Deleted

## 2015-06-01 ENCOUNTER — Encounter: Payer: Self-pay | Admitting: Obstetrics & Gynecology

## 2015-06-01 NOTE — Telephone Encounter (Signed)
Pt notified  Of last OB U/S which showed a prominent left cerebral lateral ventricle, although prominent it still has a normal measurement of 9mm.  Recheck U/S in 3 weeks to f/u.  Spoke with Dr Penne LashLeggett who agreed with this note.

## 2015-06-04 ENCOUNTER — Other Ambulatory Visit: Payer: Self-pay | Admitting: Family

## 2015-06-04 DIAGNOSIS — Z3492 Encounter for supervision of normal pregnancy, unspecified, second trimester: Secondary | ICD-10-CM

## 2015-06-07 ENCOUNTER — Ambulatory Visit (INDEPENDENT_AMBULATORY_CARE_PROVIDER_SITE_OTHER): Payer: Medicaid Other | Admitting: Obstetrics & Gynecology

## 2015-06-07 ENCOUNTER — Encounter: Payer: Self-pay | Admitting: *Deleted

## 2015-06-07 DIAGNOSIS — O99212 Obesity complicating pregnancy, second trimester: Secondary | ICD-10-CM

## 2015-06-07 DIAGNOSIS — O359XX0 Maternal care for (suspected) fetal abnormality and damage, unspecified, not applicable or unspecified: Secondary | ICD-10-CM

## 2015-06-07 DIAGNOSIS — E669 Obesity, unspecified: Secondary | ICD-10-CM

## 2015-06-07 DIAGNOSIS — Z3482 Encounter for supervision of other normal pregnancy, second trimester: Secondary | ICD-10-CM

## 2015-06-07 DIAGNOSIS — Z3490 Encounter for supervision of normal pregnancy, unspecified, unspecified trimester: Secondary | ICD-10-CM

## 2015-06-07 NOTE — Progress Notes (Signed)
Subjective:  Sandra Fuller is a 30 y.o. G3P1011 at 829w0d being seen today for ongoing prenatal care.  She is currently monitored for the following issues for this low-risk pregnancy and has Normal pregnancy, antepartum; History of cesarean delivery, currently pregnant; Two vessel umbilical cord, antepartum; Obesity in pregnancy, antepartum; Suspected fetal cerebral ventriculomegaly with prominent left ventricle; Encounter for supervision of other normal pregnancy; Request for sterilization; and Adult BMI 30+ on her problem list.  Patient reports that her lab (dog) jumped on her and hit her on her right side.  Pt did not fall.  Pt denies contractions or vaginal bleeding.  Pt reports good fetal movement. Contractions: Not present. Vag. Bleeding: None.  Movement: Present. Denies leaking of fluid.   The following portions of the patient's history were reviewed and updated as appropriate: allergies, current medications, past family history, past medical history, past social history, past surgical history and problem list. Problem list updated.  Objective:   Filed Vitals:   06/07/15 1442  BP: 129/81  Pulse: 102  Weight: 235 lb (106.595 kg)    Fetal Status: Fetal Heart Rate (bpm): 159   Movement: Present     General:  Alert, oriented and cooperative. Patient is in no acute distress.  Skin: Skin is warm and dry. No rash noted.   Cardiovascular: Normal heart rate noted  Respiratory: Normal respiratory effort, no problems with respiration noted  Abdomen: Soft, gravid, appropriate for gestational age. Pain/Pressure: Absent     Pelvic: Vag. Bleeding: None Vag D/C Character: Thin   Cervical exam deferred        Extremities: Normal range of motion.  Edema: Trace  Mental Status: Normal mood and affect. Normal behavior. Normal judgment and thought content.   Urinalysis: Urine Protein: Trace Urine Glucose: Negative  Assessment and Plan:  Pregnancy: G3P1011 at 7029w0d  1.  Supervision high risk  pregnancy -no evidence of abruption.  Pt to come to ED tonight if bleeding or contractions occur.  Pt to monitor FM which has been great.   -28 week labs next visit -BTL papers signed.  Preterm labor symptoms and general obstetric precautions including but not limited to vaginal bleeding, contractions, leaking of fluid and fetal movement were reviewed in detail with the patient. Please refer to After Visit Summary for other counseling recommendations.  Return in about 2 weeks (around 06/21/2015).   Lesly DukesKelly H Kischa Altice, MD

## 2015-06-21 ENCOUNTER — Encounter (HOSPITAL_COMMUNITY): Payer: Self-pay

## 2015-06-21 ENCOUNTER — Inpatient Hospital Stay (HOSPITAL_COMMUNITY)
Admission: AD | Admit: 2015-06-21 | Discharge: 2015-06-21 | Disposition: A | Discharge status: Home / Self Care | Payer: Medicaid Other | Source: Ambulatory Visit | Attending: Obstetrics & Gynecology | Admitting: Obstetrics & Gynecology

## 2015-06-21 DIAGNOSIS — O4702 False labor before 37 completed weeks of gestation, second trimester: Secondary | ICD-10-CM | POA: Diagnosis not present

## 2015-06-21 DIAGNOSIS — Z3A27 27 weeks gestation of pregnancy: Secondary | ICD-10-CM | POA: Diagnosis not present

## 2015-06-21 DIAGNOSIS — O479 False labor, unspecified: Secondary | ICD-10-CM

## 2015-06-21 LAB — URINALYSIS, ROUTINE W REFLEX MICROSCOPIC
Bilirubin Urine: NEGATIVE
GLUCOSE, UA: NEGATIVE mg/dL
HGB URINE DIPSTICK: NEGATIVE
KETONES UR: NEGATIVE mg/dL
LEUKOCYTES UA: NEGATIVE
Nitrite: NEGATIVE
PH: 7 (ref 5.0–8.0)
PROTEIN: NEGATIVE mg/dL
Specific Gravity, Urine: 1.025 (ref 1.005–1.030)

## 2015-06-21 NOTE — Discharge Instructions (Signed)
Braxton Hicks Contractions °Contractions of the uterus can occur throughout pregnancy. Contractions are not always a sign that you are in labor.  °WHAT ARE BRAXTON HICKS CONTRACTIONS?  °Contractions that occur before labor are called Braxton Hicks contractions, or false labor. Toward the end of pregnancy (32-34 weeks), these contractions can develop more often and may become more forceful. This is not true labor because these contractions do not result in opening (dilatation) and thinning of the cervix. They are sometimes difficult to tell apart from true labor because these contractions can be forceful and people have different pain tolerances. You should not feel embarrassed if you go to the hospital with false labor. Sometimes, the only way to tell if you are in true labor is for your health care provider to look for changes in the cervix. °If there are no prenatal problems or other health problems associated with the pregnancy, it is completely safe to be sent home with false labor and await the onset of true labor. °HOW CAN YOU TELL THE DIFFERENCE BETWEEN TRUE AND FALSE LABOR? °False Labor °· The contractions of false labor are usually shorter and not as hard as those of true labor.   °· The contractions are usually irregular.   °· The contractions are often felt in the front of the lower abdomen and in the groin.   °· The contractions may go away when you walk around or change positions while lying down.   °· The contractions get weaker and are shorter lasting as time goes on.   °· The contractions do not usually become progressively stronger, regular, and closer together as with true labor.   °True Labor °· Contractions in true labor last 30-70 seconds, become very regular, usually become more intense, and increase in frequency.   °· The contractions do not go away with walking.   °· The discomfort is usually felt in the top of the uterus and spreads to the lower abdomen and low back.   °· True labor can be  determined by your health care provider with an exam. This will show that the cervix is dilating and getting thinner.   °WHAT TO REMEMBER °· Keep up with your usual exercises and follow other instructions given by your health care provider.   °· Take medicines as directed by your health care provider.   °· Keep your regular prenatal appointments.   °· Eat and drink lightly if you think you are going into labor.   °· If Braxton Hicks contractions are making you uncomfortable:   °¨ Change your position from lying down or resting to walking, or from walking to resting.   °¨ Sit and rest in a tub of warm water.   °¨ Drink 2-3 glasses of water. Dehydration may cause these contractions.   °¨ Do slow and deep breathing several times an hour.   °WHEN SHOULD I SEEK IMMEDIATE MEDICAL CARE? °Seek immediate medical care if: °· Your contractions become stronger, more regular, and closer together.   °· You have fluid leaking or gushing from your vagina.   °· You have a fever.   °· You pass blood-tinged mucus.   °· You have vaginal bleeding.   °· You have continuous abdominal pain.   °· You have low back pain that you never had before.   °· You feel your baby's head pushing down and causing pelvic pressure.   °· Your baby is not moving as much as it used to.   °  °This information is not intended to replace advice given to you by your health care provider. Make sure you discuss any questions you have with your health care   provider. °  °Document Released: 12/19/2004 Document Revised: 12/24/2012 Document Reviewed: 09/30/2012 °Elsevier Interactive Patient Education ©2016 [employer] ° °Preterm Labor Information °Preterm labor is when labor starts at less than 37 weeks of pregnancy. The normal length of a pregnancy is 39 to 41 weeks. °CAUSES °Often, there is no identifiable underlying cause as to why a woman goes into preterm labor. One of the most common known causes of preterm labor is infection. Infections of the uterus, cervix,  vagina, amniotic sac, bladder, kidney, or even the lungs (pneumonia) can cause labor to start. Other suspected causes of preterm labor include:  °· Urogenital infections, such as yeast infections and bacterial vaginosis.   °· Uterine abnormalities (uterine shape, uterine septum, fibroids, or bleeding from the placenta).   °· A cervix that has been operated on (it may fail to stay closed).   °· Malformations in the fetus.   °· Multiple gestations (twins, triplets, and so on).   °· Breakage of the amniotic sac.   °RISK FACTORS °· Having a previous history of preterm labor.   °· Having premature rupture of membranes (PROM).   °· Having a placenta that covers the opening of the cervix (placenta previa).   °· Having a placenta that separates from the uterus (placental abruption).   °· Having a cervix that is too weak to hold the fetus in the uterus (incompetent cervix).   °· Having too much fluid in the amniotic sac (polyhydramnios).   °· Taking illegal drugs or smoking while pregnant.   °· Not gaining enough weight while pregnant.   °· Being younger than 18 and older than 31 years old.   °· Having a low socioeconomic status.   °· Being African American. °SYMPTOMS °Signs and symptoms of preterm labor include:  °· Menstrual-like cramps, abdominal pain, or back pain. °· Uterine contractions that are regular, as frequent as six in an hour, regardless of their intensity (may be mild or painful). °· Contractions that start on the top of the uterus and spread down to the lower abdomen and back.   °· A sense of increased pelvic pressure.   °· A watery or bloody mucus discharge that comes from the vagina.   °TREATMENT °Depending on the length of the pregnancy and other circumstances, your health care provider may suggest bed rest. If necessary, there are medicines that can be given to stop contractions and to mature the fetal lungs. If labor happens before 34 weeks of pregnancy, a prolonged hospital stay may be recommended.  Treatment depends on the condition of both you and the fetus.  °WHAT SHOULD YOU DO IF YOU THINK YOU ARE IN PRETERM LABOR? °Call your health care provider right away. You will need to go to the hospital to get checked immediately. °HOW CAN YOU PREVENT PRETERM LABOR IN FUTURE PREGNANCIES? °You should:  °· Stop smoking if you smoke.  °· Maintain healthy weight gain and avoid chemicals and drugs that are not necessary. °· Be watchful for any type of infection. °· Inform your health care provider if you have a known history of preterm labor. °  °This information is not intended to replace advice given to you by your health care provider. Make sure you discuss any questions you have with your health care provider. °  °Document Released: 03/11/2003 Document Revised: 08/21/2012 Document Reviewed: 01/22/2012 °Elsevier Interactive Patient Education ©2016 [employer] Inc. ° °

## 2015-06-21 NOTE — MAU Note (Signed)
Been having Braxton Hicks contractions, 3 or 4 during the night.  Then had 4 over a short period of time,  Has not had any in the last hour.   Painful. No bleeding or leaking.

## 2015-06-21 NOTE — MAU Provider Note (Signed)
History     CSN: [id number]  Arrival date and time: 06/21/15 1306   First Provider Initiated Contact with Patient 06/21/15 1403      No chief complaint on file.  HPI   Ms.Jaquelinne Glendening is a 30 y.o. female G4P1011 @ [redacted]w[redacted]d who presents to the MAU today with a three-day history of "braxton-hicks contractions". She states that she had the first contraction on Saturday afternoon while using the bathroom. Multiple other contractions occurred through Saturday afternoon and evening (while laying down), with several more happening on Sunday. All of these contractions lasted more than four minutes. Patient endorses a series of four contractions over the course of one hour while making breakfast this morning. Each of these contractions lasted for approximately four minutes. Patient has not had any abdominal pains since. Denies vaginal discharge, leaking or bleeding. Denies N/V/D with the exception of emesis x 1 last night following "spicy pizza". Endorses heartburn. Denies any drug or alcohol use. Denies MVA, fall or injury. Patient does not have a personal history of preterm labor or uterine fibroids. Denies any problems with the pregnancy of her daughter (cesarean section).   OB History    Gravida Para Term Preterm AB TAB SAB Ectopic Multiple Living   Past Medical History  Diagnosis Date  . Headache     Migraines    Past Surgical History  Procedure Laterality Date  . Cesarean section    . Arthroscopic repair acl Bilateral   . Nose surgery      broken nose  . Wisdom tooth extraction      Family History  Problem Relation Age of Onset  . Hypertension Mother   . Diabetes Mother     gestational  . Preterm labor Mother   . Fibroids Mother   . Hypertension Father   . Heart disease Father   . Cancer - Lung Paternal Grandmother     Social History  Substance Use Topics  . Smoking status: Never Smoker   . Smokeless tobacco: Never Used  . Alcohol Use: 0.0  oz/week    0 Standard drinks or equivalent per week     Comment: occassionally    Allergies: No Known Allergies  Prescriptions prior to admission  Medication Sig Dispense Refill Last Dose  . cetirizine (ZYRTEC) 10 MG tablet Take 10 mg by mouth daily.   06/20/2015 at Unknown time  . Prenatal Vit-Fe Fumarate-FA (PRENATAL VITAMINS) 28-0.8 MG TABS Take by mouth.   06/20/2015 at Unknown time  . Butalbital-APAP-Caffeine 50-325-40 MG capsule Take 1-2 capsules by mouth every 6 (six) hours as needed for headache. (Patient not taking: Reported on 06/21/2015) 30 capsule 1 Taking  . ibuprofen (ADVIL,MOTRIN) 600 MG tablet Take 1 tablet (600 mg total) by mouth every 6 (six) hours as needed for moderate pain. Do not use after [redacted] weeks gestation. (Patient not taking: Reported on 06/07/2015) 30 tablet 1 Not Taking   Results for orders placed or performed during the hospital encounter of 06/21/15 (from the past 48 hour(s))  Urinalysis, Routine w reflex microscopic (not at [facility])     Status: Abnormal   Collection Time: 06/21/15  1:25 PM  Result Value Ref Range   Color, Urine YELLOW YELLOW   APPearance HAZY (A) CLEAR   Specific Gravity, Urine 1.025 1.005 - 1.030   pH 7.0 5.0 - 8.0   Glucose, UA NEGATIVE NEGATIVE mg/dL   Hgb urine dipstick NEGATIVE  NEGATIVE   Bilirubin Urine NEGATIVE NEGATIVE   Ketones, ur NEGATIVE NEGATIVE mg/dL   Protein, ur NEGATIVE NEGATIVE mg/dL   Nitrite NEGATIVE NEGATIVE   Leukocytes, UA NEGATIVE NEGATIVE    Comment: MICROSCOPIC NOT DONE ON URINES WITH NEGATIVE PROTEIN, BLOOD, LEUKOCYTES, NITRITE, OR GLUCOSE <1000 mg/dL.    Review of Systems  Constitutional: Negative.   Eyes: Negative.   Respiratory: Positive for cough (secondary to post-nasal drip from allergies). Negative for sputum production and shortness of breath.   Cardiovascular: Negative for chest pain and palpitations.  Gastrointestinal: Positive for heartburn. Negative for nausea, vomiting (1 x emesis last night following  "spicy pizza"), abdominal pain (None currently) and diarrhea.  Genitourinary: Negative for dysuria.  Musculoskeletal: Negative for falls.  Neurological: Positive for dizziness (when getting up) and headaches. Negative for tingling.   Physical Exam   Blood pressure 126/75, pulse 98, temperature 97.8 F (36.6 C), temperature source Oral, resp. rate 18, weight 233 lb 6.4 oz (105.87 kg), last menstrual period 12/14/2014.  Physical Exam  Constitutional: She appears well-developed and well-nourished. No distress.  HENT:  Head: Normocephalic.  Eyes: Pupils are equal, round, and reactive to light.  Respiratory: Effort normal.  GI: Soft. She exhibits no distension. There is no tenderness. There is no rebound.  Genitourinary:  Cervix:  Closed, thick, posterior   Musculoskeletal: Normal range of motion.  Neurological: She is alert.  Skin: Skin is warm. She is not diaphoretic.  Psychiatric: Her behavior is normal.   Fetal Tracing: Baseline: 140 bpm  Variability: Moderate   Accelerations: 15x15 Decelerations: Quick variables  Toco: Quiet   MAU Course  Procedures  None  MDM  NST   Assessment and Plan    A:  1. Braxton Hicks contractions     P:  Discharge home in stable condition Preterm labor precautions Return to MAU if symptoms worsen  Keep appointment with OB on Thursday.  Stay well hydrated.    Duane LopeJennifer I Rasch, NP 06/21/2015 2:53 PM

## 2015-06-24 ENCOUNTER — Other Ambulatory Visit: Payer: Self-pay | Admitting: Family

## 2015-06-24 ENCOUNTER — Ambulatory Visit (HOSPITAL_COMMUNITY)
Admission: RE | Admit: 2015-06-24 | Discharge: 2015-06-24 | Disposition: A | Discharge status: Home / Self Care | Payer: Medicaid Other | Source: Ambulatory Visit | Attending: Family | Admitting: Family

## 2015-06-24 DIAGNOSIS — O34219 Maternal care for unspecified type scar from previous cesarean delivery: Secondary | ICD-10-CM

## 2015-06-24 DIAGNOSIS — Z3A27 27 weeks gestation of pregnancy: Secondary | ICD-10-CM

## 2015-06-24 DIAGNOSIS — Z1389 Encounter for screening for other disorder: Secondary | ICD-10-CM

## 2015-06-24 DIAGNOSIS — Z3492 Encounter for supervision of normal pregnancy, unspecified, second trimester: Secondary | ICD-10-CM

## 2015-06-24 DIAGNOSIS — Z36 Encounter for antenatal screening of mother: Secondary | ICD-10-CM | POA: Diagnosis present

## 2015-06-25 ENCOUNTER — Ambulatory Visit (INDEPENDENT_AMBULATORY_CARE_PROVIDER_SITE_OTHER): Payer: Medicaid Other | Admitting: Family

## 2015-06-25 VITALS — BP 131/80 | HR 97 | Wt 234.0 lb

## 2015-06-25 DIAGNOSIS — Z3482 Encounter for supervision of other normal pregnancy, second trimester: Secondary | ICD-10-CM

## 2015-06-25 DIAGNOSIS — Z3492 Encounter for supervision of normal pregnancy, unspecified, second trimester: Secondary | ICD-10-CM

## 2015-06-25 DIAGNOSIS — Z789 Other specified health status: Secondary | ICD-10-CM

## 2015-06-25 DIAGNOSIS — Z3490 Encounter for supervision of normal pregnancy, unspecified, unspecified trimester: Secondary | ICD-10-CM

## 2015-06-25 DIAGNOSIS — O34219 Maternal care for unspecified type scar from previous cesarean delivery: Secondary | ICD-10-CM

## 2015-06-25 DIAGNOSIS — Z36 Encounter for antenatal screening of mother: Secondary | ICD-10-CM

## 2015-06-25 DIAGNOSIS — Z23 Encounter for immunization: Secondary | ICD-10-CM

## 2015-06-25 DIAGNOSIS — Z302 Encounter for sterilization: Secondary | ICD-10-CM

## 2015-06-25 DIAGNOSIS — O359XX1 Maternal care for (suspected) fetal abnormality and damage, unspecified, fetus 1: Secondary | ICD-10-CM

## 2015-06-25 LAB — CBC
HCT: 38.6 % (ref 35.0–45.0)
Hemoglobin: 12.8 g/dL (ref 11.7–15.5)
MCH: 29.8 pg (ref 27.0–33.0)
MCHC: 33.2 g/dL (ref 32.0–36.0)
MCV: 90 fL (ref 80.0–100.0)
MPV: 10.8 fL (ref 7.5–12.5)
PLATELETS: 254 10*3/uL (ref 140–400)
RBC: 4.29 MIL/uL (ref 3.80–5.10)
RDW: 13.6 % (ref 11.0–15.0)
WBC: 15.3 10*3/uL — AB (ref 3.8–10.8)

## 2015-06-25 NOTE — Progress Notes (Signed)
Subjective:  Sandra Fuller is a 30 y.o. G3P1011 at 4862w4d being seen today for ongoing prenatal care.  She is currently monitored for the following issues for this high-risk pregnancy and has Normal pregnancy, antepartum; History of cesarean delivery, currently pregnant; Two vessel umbilical cord, antepartum; Obesity in pregnancy, antepartum; Suspected fetal cerebral ventriculomegaly with prominent left ventricle; Request for sterilization; and Adult BMI 30+ on her problem list.  Patient reports no complaints.  Contractions: Irregular. Vag. Bleeding: None.  Movement: Present. Denies leaking of fluid.   The following portions of the patient's history were reviewed and updated as appropriate: allergies, current medications, past family history, past medical history, past social history, past surgical history and problem list. Problem list updated.  Objective:   Filed Vitals:   06/25/15 0932  BP: 131/80  Pulse: 97  Weight: 234 lb (106.142 kg)    Fetal Status: Fetal Heart Rate (bpm): 151 Fundal Height: 31 cm Movement: Present     General:  Alert, oriented and cooperative. Patient is in no acute distress.  Skin: Skin is warm and dry. No rash noted.   Cardiovascular: Normal heart rate noted  Respiratory: Normal respiratory effort, no problems with respiration noted  Abdomen: Soft, gravid, appropriate for gestational age. Pain/Pressure: Absent     Pelvic: Cervical exam deferred        Extremities: Normal range of motion.  Edema: Trace  Mental Status: Normal mood and affect. Normal behavior. Normal judgment and thought content.   Urinalysis: Urine Protein: Trace Urine Glucose: Negative  Assessment and Plan:  Pregnancy: G3P1011 at 1662w4d  1. Normal pregnancy in second trimester - Tdap vaccine greater than or equal to 7yo IM - Glucose Tolerance, 1 HR (50g) - CBC - HIV antibody (with reflex) - RPR  2.  History of cesarean delivery, currently pregnant - Plans repeat  3. Request for  sterilization - Obtain consent at next visit  5. Known or suspected fetal anomaly, antepartum, fetus 1 - Reviewed ultrasound > resolved  6. Two vessel umbilical cord, antepartum, fetus 1 - Growth US 80%ile  Preterm labor symptoms and general obstetric precautions including but not limited to vaginal bleeding, contractions, leaking of fluid and fetal movement were reviewed in detail with the patient. Please refer to After Visit Summary for other counseling recommendations.  Return in about 2 weeks (around 07/09/2015).   Eino FarberWalidah Kennith GainN Karim, CNM

## 2015-06-26 LAB — HIV ANTIBODY (ROUTINE TESTING W REFLEX): HIV: NONREACTIVE

## 2015-06-26 LAB — GLUCOSE TOLERANCE, 1 HOUR (50G) W/O FASTING: GLUCOSE, 1 HR, GESTATIONAL: 187 mg/dL — AB (ref <140)

## 2015-06-26 LAB — RPR

## 2015-06-28 ENCOUNTER — Encounter: Payer: Self-pay | Admitting: Family

## 2015-06-28 ENCOUNTER — Telehealth: Payer: Self-pay | Admitting: *Deleted

## 2015-06-28 NOTE — Telephone Encounter (Signed)
Pt notified of abnormal 1 hr GTT.  She is scheduled for 3 hr on Thursday

## 2015-07-01 ENCOUNTER — Other Ambulatory Visit: Payer: Medicaid Other

## 2015-07-07 ENCOUNTER — Other Ambulatory Visit: Payer: Medicaid Other

## 2015-07-07 ENCOUNTER — Ambulatory Visit (INDEPENDENT_AMBULATORY_CARE_PROVIDER_SITE_OTHER): Payer: Medicaid Other | Admitting: Obstetrics and Gynecology

## 2015-07-07 VITALS — BP 140/84 | HR 98 | Wt 234.0 lb

## 2015-07-07 DIAGNOSIS — E669 Obesity, unspecified: Secondary | ICD-10-CM

## 2015-07-07 DIAGNOSIS — O34219 Maternal care for unspecified type scar from previous cesarean delivery: Secondary | ICD-10-CM

## 2015-07-07 DIAGNOSIS — E668 Other obesity: Secondary | ICD-10-CM

## 2015-07-07 DIAGNOSIS — O0993 Supervision of high risk pregnancy, unspecified, third trimester: Secondary | ICD-10-CM

## 2015-07-07 DIAGNOSIS — O99213 Obesity complicating pregnancy, third trimester: Secondary | ICD-10-CM

## 2015-07-07 DIAGNOSIS — IMO0002 Reserved for concepts with insufficient information to code with codable children: Secondary | ICD-10-CM

## 2015-07-07 NOTE — Progress Notes (Signed)
Subjective:  Sandra Fuller is a 30 y.o. G3P1011 at 6472w2d being seen today for ongoing prenatal care.  Patient reports no complaints.  Contractions: Irregular.  Vag. Bleeding: None. Movement: Present. Denies leaking of fluid.   The following portions of the patient's history were reviewed and updated as appropriate: allergies, current medications, past family history, past medical history, past social history, past surgical history and problem list.   Objective:   Filed Vitals:   07/07/15 0817  BP: 140/84  Pulse: 98  Weight: 234 lb (106.142 kg)    Fetal Status: Fetal Heart Rate (bpm): 138 Fundal Height: 30 cm Movement: Present     General:  Alert, oriented and cooperative. Patient is in no acute distress.  Skin: Skin is warm and dry. No rash noted.   Cardiovascular: Normal heart rate noted  Respiratory: Normal respiratory effort, no problems with respiration noted  Abdomen: Soft, gravid, appropriate for gestational age. Pain/Pressure: Absent     Vaginal: Vag. Bleeding: None.    Vag D/C Character: Thin  Cervix: Not evaluated        Extremities: Normal range of motion.  Edema: Trace  Mental Status: Normal mood and affect. Normal behavior. Normal judgment and thought content.   Urinalysis: Urine Protein: 1+ Urine Glucose: Negative  Assessment and Plan:  Pregnancy: G3P1011 at 5072w2d  1. Supervision of high risk pregnancy, antepartum, third trimester Patient is doing well without complaints 3 hour glucola today  2. History of cesarean delivery, currently pregnant Desires repeat  3. Two vessel umbilical cord, antepartum, not applicable or unspecified fetus Follow up growth ultrasound ordered - US MFM OB FOLLOW UP; Future  4. Obesity in pregnancy, antepartum, third trimester   5. Adult BMI 30+   Preterm labor symptoms and general obstetric precautions including but not limited to vaginal bleeding, contractions, leaking of fluid and fetal movement were reviewed in detail with  the patient. Please refer to After Visit Summary for other counseling recommendations.  Return in about 2 weeks (around 07/21/2015).   Catalina AntiguaPeggy Jodine Muchmore, MD

## 2015-07-07 NOTE — Progress Notes (Signed)
Monitor BP 

## 2015-07-08 ENCOUNTER — Encounter (HOSPITAL_COMMUNITY): Payer: Self-pay | Admitting: *Deleted

## 2015-07-08 ENCOUNTER — Telehealth: Payer: Self-pay | Admitting: *Deleted

## 2015-07-08 DIAGNOSIS — O0993 Supervision of high risk pregnancy, unspecified, third trimester: Secondary | ICD-10-CM

## 2015-07-08 LAB — GLUCOSE TOLERANCE, 3 HOURS
GLUCOSE, 1 HOUR-GESTATIONAL: 157 mg/dL (ref <190)
GLUCOSE, 2 HOUR-GESTATIONAL: 109 mg/dL (ref <165)
GLUCOSE, FASTING-GESTATIONAL: 81 mg/dL (ref 65–104)
Glucose, GTT - 3 Hour: 155 mg/dL — ABNORMAL HIGH (ref <145)

## 2015-07-08 NOTE — Telephone Encounter (Signed)
Pt notified of normal 3 hr GTT. 

## 2015-07-09 ENCOUNTER — Encounter: Payer: Medicaid Other | Admitting: Family

## 2015-07-13 ENCOUNTER — Other Ambulatory Visit: Payer: Self-pay | Admitting: Family Medicine

## 2015-07-20 ENCOUNTER — Ambulatory Visit (INDEPENDENT_AMBULATORY_CARE_PROVIDER_SITE_OTHER): Payer: Medicaid Other | Admitting: Obstetrics & Gynecology

## 2015-07-20 VITALS — BP 140/91 | HR 109 | Wt 235.0 lb

## 2015-07-20 DIAGNOSIS — E669 Obesity, unspecified: Secondary | ICD-10-CM

## 2015-07-20 DIAGNOSIS — O99213 Obesity complicating pregnancy, third trimester: Secondary | ICD-10-CM

## 2015-07-20 DIAGNOSIS — O0993 Supervision of high risk pregnancy, unspecified, third trimester: Secondary | ICD-10-CM

## 2015-07-20 DIAGNOSIS — O34219 Maternal care for unspecified type scar from previous cesarean delivery: Secondary | ICD-10-CM

## 2015-07-20 DIAGNOSIS — O359XX1 Maternal care for (suspected) fetal abnormality and damage, unspecified, fetus 1: Secondary | ICD-10-CM

## 2015-07-20 NOTE — Progress Notes (Signed)
Pink spotting for several days with pressure

## 2015-07-20 NOTE — Progress Notes (Signed)
Subjective:  Sandra Fuller is a 30 y.o. G3P1011 at 521w1d being seen today for ongoing prenatal care.  She is currently monitored for the following issues for this low-risk pregnancy and has Supervision of high risk pregnancy, antepartum; History of cesarean delivery, currently pregnant; Two vessel umbilical cord, antepartum; Obesity in pregnancy, antepartum; Suspected fetal cerebral ventriculomegaly with prominent left ventricle; Request for sterilization; and Adult BMI 30+ on her problem list.  Patient reports She reports some occasional spotting over the last few weeks..  Contractions: Irritability. Vag. Bleeding: Scant.  Movement: Present. Denies leaking of fluid.   The following portions of the patient's history were reviewed and updated as appropriate: allergies, current medications, past family history, past medical history, past social history, past surgical history and problem list. Problem list updated.  Objective:   Filed Vitals:   07/20/15 1357  BP: 140/91  Pulse: 109  Weight: 235 lb (106.595 kg)    Fetal Status:     Movement: Present     General:  Alert, oriented and cooperative. Patient is in no acute distress.  Skin: Skin is warm and dry. No rash noted.   Cardiovascular: Normal heart rate noted  Respiratory: Normal respiratory effort, no problems with respiration noted  Abdomen: Soft, gravid, appropriate for gestational age. Pain/Pressure: Present     Pelvic:  Cervical exam performed       cl/th/high/ no blood seen on my glove  Extremities: Normal range of motion.  Edema: Trace  Mental Status: Normal mood and affect. Normal behavior. Normal judgment and thought content.   Urinalysis: Urine Protein: 1+ Urine Glucose: Negative  Assessment and Plan:  Pregnancy: G3P1011 at 191w1d  1. History of cesarean delivery, currently pregnant   2. Obesity in pregnancy, antepartum, third trimester   3. Known or suspected fetal anomaly, antepartum, fetus 1 -resolved  4. Two  vessel umbilical cord, antepartum, not applicable or unspecified fetus   Preterm labor symptoms and general obstetric precautions including but not limited to vaginal bleeding, contractions, leaking of fluid and fetal movement were reviewed in detail with the patient. Please refer to After Visit Summary for other counseling recommendations.  No Follow-up on file.   Allie BossierMyra C Darielle Hancher, MD

## 2015-07-23 ENCOUNTER — Encounter: Payer: Medicaid Other | Admitting: Family

## 2015-07-29 ENCOUNTER — Ambulatory Visit (INDEPENDENT_AMBULATORY_CARE_PROVIDER_SITE_OTHER): Payer: Medicaid Other | Admitting: Obstetrics & Gynecology

## 2015-07-29 VITALS — BP 135/79 | HR 94 | Wt 242.0 lb

## 2015-07-29 DIAGNOSIS — O368120 Decreased fetal movements, second trimester, not applicable or unspecified: Secondary | ICD-10-CM

## 2015-07-29 NOTE — Progress Notes (Signed)
She is here because she had decreased FM. NST reactive, reassurance given.

## 2015-08-03 ENCOUNTER — Ambulatory Visit (INDEPENDENT_AMBULATORY_CARE_PROVIDER_SITE_OTHER): Payer: Medicaid Other | Admitting: Obstetrics & Gynecology

## 2015-08-03 VITALS — BP 141/92 | HR 96 | Wt 244.0 lb

## 2015-08-03 DIAGNOSIS — O133 Gestational [pregnancy-induced] hypertension without significant proteinuria, third trimester: Secondary | ICD-10-CM | POA: Diagnosis not present

## 2015-08-03 DIAGNOSIS — O09893 Supervision of other high risk pregnancies, third trimester: Secondary | ICD-10-CM | POA: Diagnosis not present

## 2015-08-03 MED ORDER — BETAMETHASONE SOD PHOS & ACET 6 (3-3) MG/ML IJ SUSP
12.0000 mg | Freq: Once | INTRAMUSCULAR | Status: AC
  Administered 2015-08-03: 12 mg via INTRAMUSCULAR

## 2015-08-03 MED ORDER — BETAMETHASONE SOD PHOS & ACET 6 (3-3) MG/ML IJ SUSP: 12.0000 mg | Freq: Once | INTRAMUSCULAR | Status: DC

## 2015-08-03 NOTE — Progress Notes (Signed)
Subjective:  Sandra Fuller is a 30 y.o. G3P1011 at 52w1dbeing seen today for ongoing prenatal care.  She is currently monitored for the following issues for this high-risk pregnancy and has Supervision of high risk pregnancy, antepartum; History of cesarean delivery, currently pregnant; Two vessel umbilical cord, antepartum; Obesity in pregnancy, antepartum; Suspected fetal cerebral ventriculomegaly with prominent left ventricle; Request for sterilization; and Adult BMI 30+ on her problem list.  Patient reports vomiting.  Contractions: Irregular. Vag. Bleeding: None.  Movement: Present. Denies leaking of fluid.   The following portions of the patient's history were reviewed and updated as appropriate: allergies, current medications, past family history, past medical history, past social history, past surgical history and problem list. Problem list updated.  Objective:   Vitals:   08/03/15 1340  BP: (!) 141/92  Pulse: 96  Weight: 244 lb (110.7 kg)    Fetal Status: Fetal Heart Rate (bpm): 154   Movement: Present     General:  Alert, oriented and cooperative. Patient is in no acute distress.  Skin: Skin is warm and dry. No rash noted.   Cardiovascular: Normal heart rate noted  Respiratory: Normal respiratory effort, no problems with respiration noted  Abdomen: Soft, gravid, appropriate for gestational age. Pain/Pressure: Present     Pelvic:  Cervical exam deferred        Extremities: Normal range of motion.  Edema: Trace  Mental Status: Normal mood and affect. Normal behavior. Normal judgment and thought content.   Urinalysis: Urine Protein: Trace Urine Glucose: Negative  Assessment and Plan:  Pregnancy: G3P1011 at 340w1d1. Gestational hypertension, third trimester Pt has had two visits with BP 140s/90s.  Pt reports hand swelling.  DTR nml.  Mild headache but has had headache all of pregnancy.  Will get Pre E labs, NST today, Growth USKoreand BPP this week with MFM.  [facility]  today and tomorrow [facility] [place] Protein / creatinine ratio, urine - CBC - Comp Met (CMET)  NST reactive Pre E symptoms reviewed.  Preterm labor symptoms and general obstetric precautions including but not limited to vaginal bleeding, contractions, leaking of fluid and fetal movement were reviewed in detail with the patient. Please refer to After Visit Summary for other counseling recommendations.  Return in about 6 days (around 08/09/2015).   KeGuss BundeMD

## 2015-08-03 NOTE — Progress Notes (Signed)
Pt. States that she recently "threw up everything she ate" for two days.

## 2015-08-03 NOTE — Addendum Note (Signed)
Addended by: Granville Lewis on: 08/03/2015 04:30 PM   Modules accepted: Orders

## 2015-08-04 ENCOUNTER — Encounter: Payer: Self-pay | Admitting: Obstetrics & Gynecology

## 2015-08-04 ENCOUNTER — Ambulatory Visit (INDEPENDENT_AMBULATORY_CARE_PROVIDER_SITE_OTHER): Payer: Medicaid Other | Admitting: *Deleted

## 2015-08-04 ENCOUNTER — Encounter: Payer: Self-pay | Admitting: *Deleted

## 2015-08-04 VITALS — BP 140/88 | HR 102 | Ht 66.0 in | Wt 240.0 lb

## 2015-08-04 DIAGNOSIS — O133 Gestational [pregnancy-induced] hypertension without significant proteinuria, third trimester: Secondary | ICD-10-CM

## 2015-08-04 DIAGNOSIS — Z331 Pregnant state, incidental: Secondary | ICD-10-CM | POA: Diagnosis not present

## 2015-08-04 DIAGNOSIS — Z1389 Encounter for screening for other disorder: Secondary | ICD-10-CM | POA: Diagnosis not present

## 2015-08-04 LAB — COMPREHENSIVE METABOLIC PANEL
ALK PHOS: 99 U/L (ref 33–115)
ALT: 5 U/L — AB (ref 6–29)
AST: 10 U/L (ref 10–30)
Albumin: 3.1 g/dL — ABNORMAL LOW (ref 3.6–5.1)
BILIRUBIN TOTAL: 0.3 mg/dL (ref 0.2–1.2)
BUN: 6 mg/dL — AB (ref 7–25)
CALCIUM: 8.8 mg/dL (ref 8.6–10.2)
CO2: 22 mmol/L (ref 20–31)
Chloride: 107 mmol/L (ref 98–110)
Creat: 0.42 mg/dL — ABNORMAL LOW (ref 0.50–1.10)
GLUCOSE: 85 mg/dL (ref 65–99)
Potassium: 4.3 mmol/L (ref 3.5–5.3)
Sodium: 140 mmol/L (ref 135–146)
TOTAL PROTEIN: 5.7 g/dL — AB (ref 6.1–8.1)

## 2015-08-04 LAB — POCT URINALYSIS DIPSTICK
GLUCOSE UA: NEGATIVE
Leukocytes, UA: NEGATIVE
NITRITE UA: NEGATIVE
Protein, UA: NEGATIVE
RBC UA: NEGATIVE

## 2015-08-04 LAB — CBC
HEMATOCRIT: 38.5 % (ref 35.0–45.0)
Hemoglobin: 12.7 g/dL (ref 11.7–15.5)
MCH: 29.4 pg (ref 27.0–33.0)
MCHC: 33 g/dL (ref 32.0–36.0)
MCV: 89.1 fL (ref 80.0–100.0)
MPV: 11.3 fL (ref 7.5–12.5)
PLATELETS: 237 10*3/uL (ref 140–400)
RBC: 4.32 MIL/uL (ref 3.80–5.10)
RDW: 13.8 % (ref 11.0–15.0)
WBC: 12.2 10*3/uL — AB (ref 3.8–10.8)

## 2015-08-04 LAB — PROTEIN / CREATININE RATIO, URINE
CREATININE, URINE: 194 mg/dL (ref 20–320)
Protein Creatinine Ratio: 232 mg/g creat — ABNORMAL HIGH (ref 21–161)
Total Protein, Urine: 45 mg/dL — ABNORMAL HIGH (ref 5–24)

## 2015-08-04 MED ORDER — BETAMETHASONE SOD PHOS & ACET 6 (3-3) MG/ML IJ SUSP
12.0000 mg | Freq: Once | INTRAMUSCULAR | Status: AC
  Administered 2015-08-04: 12 mg via INTRAMUSCULAR

## 2015-08-04 NOTE — Progress Notes (Signed)
Pt here for Betamethasone 12 mg. Pt tolerated shot well. Return at scheduled appt in [place] or call us if need be. Pt voiced understanding. JSY

## 2015-08-05 ENCOUNTER — Other Ambulatory Visit: Payer: Self-pay | Admitting: Obstetrics and Gynecology

## 2015-08-05 ENCOUNTER — Ambulatory Visit (HOSPITAL_COMMUNITY)
Admission: RE | Admit: 2015-08-05 | Discharge: 2015-08-05 | Disposition: A | Discharge status: Home / Self Care | Payer: Medicaid Other | Source: Ambulatory Visit | Attending: Obstetrics and Gynecology | Admitting: Obstetrics and Gynecology

## 2015-08-05 DIAGNOSIS — Z3A33 33 weeks gestation of pregnancy: Secondary | ICD-10-CM | POA: Diagnosis not present

## 2015-08-05 DIAGNOSIS — O133 Gestational [pregnancy-induced] hypertension without significant proteinuria, third trimester: Secondary | ICD-10-CM

## 2015-08-09 ENCOUNTER — Ambulatory Visit (INDEPENDENT_AMBULATORY_CARE_PROVIDER_SITE_OTHER): Payer: Medicaid Other | Admitting: Obstetrics & Gynecology

## 2015-08-09 VITALS — BP 142/87 | HR 90 | Wt 251.0 lb

## 2015-08-09 DIAGNOSIS — O0993 Supervision of high risk pregnancy, unspecified, third trimester: Secondary | ICD-10-CM

## 2015-08-09 DIAGNOSIS — O133 Gestational [pregnancy-induced] hypertension without significant proteinuria, third trimester: Secondary | ICD-10-CM

## 2015-08-09 DIAGNOSIS — E669 Obesity, unspecified: Secondary | ICD-10-CM

## 2015-08-09 DIAGNOSIS — O99213 Obesity complicating pregnancy, third trimester: Secondary | ICD-10-CM

## 2015-08-09 NOTE — Progress Notes (Signed)
Subjective:  Sandra Fuller is a 30 y.o. G3P1011 at 648w0d being seen today for ongoing prenatal care.  She is currently monitored for the following issues for this high-risk pregnancy and has Supervision of high risk pregnancy, antepartum; History of cesarean delivery, currently pregnant; Two vessel umbilical cord, antepartum; Obesity in pregnancy, antepartum; Suspected fetal cerebral ventriculomegaly with prominent left ventricle; Request for sterilization; Adult BMI 30+; and Gestational hypertension w/o significant proteinuria in 3rd trimester on her problem list.  Patient reports She has had a headache and visual changes for 2 weeks..  Contractions: Irregular. Vag. Bleeding: None.  Movement: Present. Denies leaking of fluid.   The following portions of the patient's history were reviewed and updated as appropriate: allergies, current medications, past family history, past medical history, past social history, past surgical history and problem list. Problem list updated.  Objective:   Vitals:   08/09/15 1304  BP: (!) 142/87  Pulse: 90  Weight: 251 lb (113.9 kg)    Fetal Status:     Movement: Present     General:  Alert, oriented and cooperative. Patient is in no acute distress.  Skin: Skin is warm and dry. No rash noted.   Cardiovascular: Normal heart rate noted  Respiratory: Normal respiratory effort, no problems with respiration noted  Abdomen: Soft, gravid, appropriate for gestational age. Pain/Pressure: Present     Pelvic:  Cervical exam deferred        Extremities: Normal range of motion.  Edema: Mild pitting, slight indentation  Mental Status: Normal mood and affect. Normal behavior. Normal judgment and thought content.  DTRs- 1+ BL and equal Urinalysis:      Assessment and Plan:  Pregnancy: G3P1011 at 578w0d  There are no diagnoses linked to this encounter. Preterm labor symptoms and general obstetric precautions including but not limited to vaginal bleeding, contractions,  leaking of fluid and fetal movement were reviewed in detail with the patient. Please refer to After Visit Summary for other counseling recommendations.  No Follow-up on file.  She will start another 24 hour urine tomorrow. We discussed pre eclampsia precautions. Sandra BossierMyra C Dawnetta Copenhaver, MD

## 2015-08-10 ENCOUNTER — Inpatient Hospital Stay (HOSPITAL_COMMUNITY)
Admission: AD | Admit: 2015-08-10 | Discharge: 2015-08-12 | DRG: 781 | Disposition: A | Discharge status: Home / Self Care | Payer: Medicaid Other | Source: Ambulatory Visit | Attending: Obstetrics and Gynecology | Admitting: Obstetrics and Gynecology

## 2015-08-10 ENCOUNTER — Encounter (HOSPITAL_COMMUNITY): Payer: Self-pay | Admitting: *Deleted

## 2015-08-10 ENCOUNTER — Telehealth: Payer: Self-pay | Admitting: *Deleted

## 2015-08-10 DIAGNOSIS — E669 Obesity, unspecified: Secondary | ICD-10-CM | POA: Diagnosis present

## 2015-08-10 DIAGNOSIS — O1400 Mild to moderate pre-eclampsia, unspecified trimester: Secondary | ICD-10-CM | POA: Diagnosis present

## 2015-08-10 DIAGNOSIS — Z3A34 34 weeks gestation of pregnancy: Secondary | ICD-10-CM

## 2015-08-10 DIAGNOSIS — O133 Gestational [pregnancy-induced] hypertension without significant proteinuria, third trimester: Secondary | ICD-10-CM

## 2015-08-10 DIAGNOSIS — Z8249 Family history of ischemic heart disease and other diseases of the circulatory system: Secondary | ICD-10-CM

## 2015-08-10 DIAGNOSIS — O0993 Supervision of high risk pregnancy, unspecified, third trimester: Secondary | ICD-10-CM

## 2015-08-10 DIAGNOSIS — Z825 Family history of asthma and other chronic lower respiratory diseases: Secondary | ICD-10-CM

## 2015-08-10 DIAGNOSIS — O99213 Obesity complicating pregnancy, third trimester: Secondary | ICD-10-CM

## 2015-08-10 DIAGNOSIS — O34219 Maternal care for unspecified type scar from previous cesarean delivery: Secondary | ICD-10-CM | POA: Diagnosis present

## 2015-08-10 DIAGNOSIS — Z833 Family history of diabetes mellitus: Secondary | ICD-10-CM

## 2015-08-10 DIAGNOSIS — O1403 Mild to moderate pre-eclampsia, third trimester: Secondary | ICD-10-CM | POA: Diagnosis not present

## 2015-08-10 HISTORY — DX: Essential (primary) hypertension: I10

## 2015-08-10 LAB — CBC
HCT: 38.4 % (ref 36.0–46.0)
Hemoglobin: 13.2 g/dL (ref 12.0–15.0)
MCH: 29.8 pg (ref 26.0–34.0)
MCHC: 34.4 g/dL (ref 30.0–36.0)
MCV: 86.7 fL (ref 78.0–100.0)
PLATELETS: 225 10*3/uL (ref 150–400)
RBC: 4.43 MIL/uL (ref 3.87–5.11)
RDW: 14 % (ref 11.5–15.5)
WBC: 15.4 10*3/uL — AB (ref 4.0–10.5)

## 2015-08-10 LAB — URINALYSIS, ROUTINE W REFLEX MICROSCOPIC
Bilirubin Urine: NEGATIVE
GLUCOSE, UA: NEGATIVE mg/dL
HGB URINE DIPSTICK: NEGATIVE
Ketones, ur: NEGATIVE mg/dL
LEUKOCYTES UA: NEGATIVE
Nitrite: NEGATIVE
PH: 7 (ref 5.0–8.0)
PROTEIN: NEGATIVE mg/dL
Specific Gravity, Urine: 1.01 (ref 1.005–1.030)

## 2015-08-10 LAB — COMPREHENSIVE METABOLIC PANEL
ALT: 9 U/L — AB (ref 14–54)
AST: 14 U/L — AB (ref 15–41)
Albumin: 2.9 g/dL — ABNORMAL LOW (ref 3.5–5.0)
Alkaline Phosphatase: 87 U/L (ref 38–126)
Anion gap: 10 (ref 5–15)
BILIRUBIN TOTAL: 0.2 mg/dL — AB (ref 0.3–1.2)
BUN: 8 mg/dL (ref 6–20)
CALCIUM: 9.1 mg/dL (ref 8.9–10.3)
CHLORIDE: 103 mmol/L (ref 101–111)
CO2: 21 mmol/L — ABNORMAL LOW (ref 22–32)
CREATININE: 0.48 mg/dL (ref 0.44–1.00)
Glucose, Bld: 83 mg/dL (ref 65–99)
Potassium: 3.7 mmol/L (ref 3.5–5.1)
Sodium: 134 mmol/L — ABNORMAL LOW (ref 135–145)
TOTAL PROTEIN: 6.7 g/dL (ref 6.5–8.1)

## 2015-08-10 LAB — WET PREP, GENITAL
Clue Cells Wet Prep HPF POC: NONE SEEN
Sperm: NONE SEEN
Trich, Wet Prep: NONE SEEN
YEAST WET PREP: NONE SEEN

## 2015-08-10 LAB — PROTEIN / CREATININE RATIO, URINE
CREATININE, URINE: 46 mg/dL
PROTEIN CREATININE RATIO: 0.3 mg/mg{creat} — AB (ref 0.00–0.15)
TOTAL PROTEIN, URINE: 14 mg/dL

## 2015-08-10 LAB — TYPE AND SCREEN
ABO/RH(D): O POS
Antibody Screen: NEGATIVE

## 2015-08-10 LAB — ABO/RH: ABO/RH(D): O POS

## 2015-08-10 MED ORDER — ZOLPIDEM TARTRATE 5 MG PO TABS: 5.0000 mg | ORAL_TABLET | Freq: Every evening | ORAL | Status: DC | PRN

## 2015-08-10 MED ORDER — ACETAMINOPHEN 325 MG PO TABS
650.0000 mg | ORAL_TABLET | ORAL | Status: DC | PRN
  Administered 2015-08-10: 650 mg via ORAL
  Filled 2015-08-10: qty 2

## 2015-08-10 MED ORDER — CALCIUM CARBONATE ANTACID 500 MG PO CHEW: 2.0000 | CHEWABLE_TABLET | ORAL | Status: DC | PRN

## 2015-08-10 MED ORDER — DIPHENHYDRAMINE HCL 50 MG/ML IJ SOLN
25.0000 mg | Freq: Once | INTRAMUSCULAR | Status: AC
  Administered 2015-08-10: 25 mg via INTRAVENOUS
  Filled 2015-08-10: qty 1

## 2015-08-10 MED ORDER — DEXAMETHASONE SODIUM PHOSPHATE 10 MG/ML IJ SOLN
10.0000 mg | Freq: Once | INTRAMUSCULAR | Status: AC
  Administered 2015-08-10: 10 mg via INTRAVENOUS
  Filled 2015-08-10: qty 1

## 2015-08-10 MED ORDER — OXYCODONE-ACETAMINOPHEN 5-325 MG PO TABS
2.0000 | ORAL_TABLET | Freq: Once | ORAL | Status: AC
  Administered 2015-08-10: 2 via ORAL
  Filled 2015-08-10: qty 2

## 2015-08-10 MED ORDER — PRENATAL MULTIVITAMIN CH
1.0000 | ORAL_TABLET | Freq: Every day | ORAL | Status: DC
  Administered 2015-08-11: 1 via ORAL
  Filled 2015-08-10: qty 1

## 2015-08-10 MED ORDER — LACTATED RINGERS IV SOLN
INTRAVENOUS | Status: DC
  Administered 2015-08-10 – 2015-08-11 (×2): via INTRAVENOUS

## 2015-08-10 MED ORDER — SODIUM CHLORIDE 0.9 % IV SOLN: INTRAVENOUS | Status: DC

## 2015-08-10 MED ORDER — DOCUSATE SODIUM 100 MG PO CAPS
100.0000 mg | ORAL_CAPSULE | Freq: Every day | ORAL | Status: DC
  Administered 2015-08-12: 100 mg via ORAL
  Filled 2015-08-10: qty 1

## 2015-08-10 MED ORDER — CYCLOBENZAPRINE HCL 10 MG PO TABS
5.0000 mg | ORAL_TABLET | Freq: Once | ORAL | Status: AC
  Administered 2015-08-10: 5 mg via ORAL
  Filled 2015-08-10: qty 1

## 2015-08-10 MED ORDER — METOCLOPRAMIDE HCL 5 MG/ML IJ SOLN
10.0000 mg | Freq: Once | INTRAMUSCULAR | Status: AC
  Administered 2015-08-10: 10 mg via INTRAVENOUS
  Filled 2015-08-10: qty 2

## 2015-08-10 NOTE — Telephone Encounter (Signed)
Pt called stating that she had an episode where the side of her face and arm went numb for about 20 minutes.  Her BP @ home was 130/88.  She states that she has not done anything but fix her daughter lunch today.  She stated that she had this happen once during her last pregnancy but never went to seek medical attention.  I advised pt to go to MAU for evaluation.  She does currently have gestational hypertension and is doing BIW NST's.  She is in the process of doing a 24 hr urine.

## 2015-08-10 NOTE — MAU Note (Signed)
Pt states she has been experiencing a HA since noon.  Hasn't taken anything for it. Pt states she has been monitored closely for PIH.  Suppose to start a 24 hour urine tomorrow to take to the office on Thurs.  Pt reports her BP at home was 140/100.  States her face and arms have been numb as well.  Pt appears to be very anxious. Denies epigastric pain.  Some blurry vision earlier but none now.  Good fetal movement.  Denies vaginal bleeding or ROM.  Pt states she has been having an increase in clear thin discharge without odor.

## 2015-08-10 NOTE — H&P (Signed)
ANTEPARTUM ADMISSION HISTORY AND PHYSICAL NOTE   History of Present Illness: Sandra Fuller is a 30 y.o. G3P1011 at [redacted]w[redacted]d admitted for pre-eclampsia with severe features. Pt has had some BP in the 140's in the out patient office. She had pre-eclamptic labs then which where normal. Today she developed severe headache, unlike her normal headaches that was associated with blurred vision and parasthesias.  She still ha severe headache it is primarily right sided and her numbness persists on her left upper extremity.  Patient reports the fetal movement as active. Patient reports uterine contraction  activity as none. Patient reports  vaginal bleeding as none. Patient describes fluid per vagina as None. Fetal presentation is cephalic.  Patient Active Problem List   Diagnosis Date Noted  . Mild pre-eclampsia 08/10/2015  . Gestational hypertension w/o significant proteinuria in 3rd trimester 08/04/2015  . 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 Obesity in pregnancy, antepartum 05/26/2015  . Two vessel umbilical cord, antepartum 05/02/2015  . Supervision of high risk pregnancy, antepartum 03/01/2015  . History of cesarean delivery, currently pregnant 03/01/2015  . Request for sterilization 01/27/2015  . Adult BMI 30+ 01/27/2015    Past Medical History:  Diagnosis Date  . Headache    Migraines  . Hypertension     Past Surgical History:  Procedure Laterality Date  . ARTHROSCOPIC REPAIR ACL Bilateral   . CESAREAN SECTION    . NOSE SURGERY     broken nose  . WISDOM TOOTH EXTRACTION      OB History  Gravida Para Term Preterm AB Living  3 1 1   1 1   SAB TAB Ectopic Multiple Live Births  1       1    # Outcome Date GA Lbr Len/2nd Weight Sex Delivery Anes PTL Lv  3 Current           2 Term 10/31/07   6 lb (2.722 kg) F CS-LTranv   LIV     Complications: Failure to Progress in First Stage,Two vessel umbilical cord, antepartum, fetus 2  1  SAB               Social History   Social History  . Marital status: Single    Spouse name: N/A  . Number of children: N/A  . Years of education: N/A   Occupational History  . homemeaker    Social History Main Topics  . Smoking status: Never Smoker  . Smokeless tobacco: Never Used  . Alcohol use No     Comment: occassionally; not now  . Drug use: No  . Sexual activity: Yes    Partners: Male    Birth control/ protection: None   Other Topics Concern  . None   Social History Narrative  . None    Family History  Problem Relation Age of Onset  . Hypertension Mother   . Diabetes Mother     gestational  . Preterm labor Mother   . Fibroids Mother   . Hypertension Father   . Heart disease Father   . Cancer - Lung Paternal Grandmother   . ADD / ADHD Daughter   . Asthma Daughter     No Known Allergies  Prescriptions Prior to Admission  Medication Sig Dispense Refill Last Dose  . [DISCONTINUED] Prenatal Vit-Fe Fumarate-FA (PRENATAL VITAMINS) 28-0.8 MG TABS Take by mouth.   08/10/2015 at Unknown time    Review of Systems  Constitutional: Negative for chills and fever.  HENT: Negative for congestion and tinnitus.   Eyes: Positive for blurred vision, photophobia and pain. Negative for double vision.  Respiratory: Negative for cough and shortness of breath.   Cardiovascular: Negative for chest pain and palpitations.  Gastrointestinal: Negative for heartburn, nausea and vomiting.  Genitourinary: Negative for dysuria and urgency.  Musculoskeletal: Positive for back pain. Negative for myalgias.  Skin: Negative for itching and rash.  Neurological: Positive for dizziness, tingling, sensory change and headaches.  Endo/Heme/Allergies: Does not bruise/bleed easily.  Psychiatric/Behavioral: Negative for depression and substance abuse.    Vitals:  BP 129/89   Pulse 91   Temp 98.5 F (36.9 C) (Oral)   Resp 20   LMP 12/14/2014   SpO2 99%  Physical Examination: Physical  Exam  Constitutional: She is oriented to person, place, and time. She appears well-developed and well-nourished.  HENT:  Head: Normocephalic and atraumatic.  Eyes: EOM are normal. Pupils are equal, round, and reactive to light.  Neck: Normal range of motion. Neck supple.  Cardiovascular: Normal rate and regular rhythm.   Pulmonary/Chest: Effort normal and breath sounds normal. No respiratory distress.  Abdominal: Soft. Bowel sounds are normal. She exhibits no distension. There is no tenderness.  Musculoskeletal: Normal range of motion. She exhibits no edema or deformity.  Neurological: She is alert and oriented to person, place, and time. No cranial nerve deficit. Coordination normal.  Deep tendon reflexes at the patella 2+  Skin: Skin is warm and dry. Capillary refill takes less than 2 seconds. No erythema.  Psychiatric: She has a normal mood and affect. Her behavior is normal.    Cervix: Evaluated by digital exam. and found to be 1 cm/ Long/-3 and fetal presentation is cephalic. Membranes:intact Fetal Monitoring:Baseline: 140 bpm Tocometer: Flat  Labs:  Results for orders placed or performed during the hospital encounter of 08/10/15 (from the past 24 hour(s))  Urinalysis, Routine w reflex microscopic (not at [facility])   Collection Time: 08/10/15  5:30 PM  Result Value Ref Range   Color, Urine YELLOW YELLOW   APPearance CLEAR CLEAR   Specific Gravity, Urine 1.010 1.005 - 1.030   pH 7.0 5.0 - 8.0   Glucose, UA NEGATIVE NEGATIVE mg/dL   Hgb urine dipstick NEGATIVE NEGATIVE   Bilirubin Urine NEGATIVE NEGATIVE   Ketones, ur NEGATIVE NEGATIVE mg/dL   Protein, ur NEGATIVE NEGATIVE mg/dL   Nitrite NEGATIVE NEGATIVE   Leukocytes, UA NEGATIVE NEGATIVE  Protein / creatinine ratio, urine   Collection Time: 08/10/15  5:30 PM  Result Value Ref Range   Creatinine, Urine 46.00 mg/dL   Total Protein, Urine 14 mg/dL   Protein Creatinine Ratio 0.30 (H) 0.00 - 0.15 mg/mg[Cre]  Comprehensive  metabolic panel   Collection Time: 08/10/15  5:38 PM  Result Value Ref Range   Sodium 134 (L) 135 - 145 mmol/L   Potassium 3.7 3.5 - 5.1 mmol/L   Chloride 103 101 - 111 mmol/L   CO2 21 (L) 22 - 32 mmol/L   Glucose, Bld 83 65 - 99 mg/dL   BUN 8 6 - 20 mg/dL   Creatinine, Ser 8.29 0.44 - 1.00 mg/dL   Calcium 9.1 8.9 - 56.2 mg/dL   Total Protein 6.7 6.5 - 8.1 g/dL   Albumin 2.9 (L) 3.5 - 5.0 g/dL   AST 14 (L) 15 - 41 U/L   ALT 9 (L) 14 - 54 U/L   Alkaline Phosphatase 87 38 - 126 U/L   Total Bilirubin 0.2 (L) 0.3 - 1.2 mg/dL  GFR calc non Af Amer >60 >60 mL/min   GFR calc Af Amer >60 >60 mL/min   Anion gap 10 5 - 15  CBC   Collection Time: 08/10/15  5:38 PM  Result Value Ref Range   WBC 15.4 (H) 4.0 - 10.5 K/uL   RBC 4.43 3.87 - 5.11 MIL/uL   Hemoglobin 13.2 12.0 - 15.0 g/dL   HCT 40.938.4 81.136.0 - 91.446.0 %   MCV 86.7 78.0 - 100.0 fL   MCH 29.8 26.0 - 34.0 pg   MCHC 34.4 30.0 - 36.0 g/dL   RDW 78.214.0 95.611.5 - 21.315.5 %   Platelets 225 150 - 400 K/uL  Wet prep, genital   Collection Time: 08/10/15  5:50 PM  Result Value Ref Range   Yeast Wet Prep HPF POC NONE SEEN NONE SEEN   Trich, Wet Prep NONE SEEN NONE SEEN   Clue Cells Wet Prep HPF POC NONE SEEN NONE SEEN   WBC, Wet Prep HPF POC FEW (A) NONE SEEN   Sperm NONE SEEN     Imaging Studies: [facility] Ob Follow Up  Result Date: 08/05/2015 OBSTETRICAL ULTRASOUND: This exam was performed within a [place]. The OB US report was generated in the AS system, and faxed to the ordering physician.  This report is available in the [facility] [employer] PACS. See the AS Obstetric US report via the Image Link.    Assessment and Plan: Patient Active Problem List   Diagnosis Date Noted  . Mild pre-eclampsia 08/10/2015  . Gestational hypertension w/o significant proteinuria in 3rd trimester 08/04/2015  . 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 Obesity in pregnancy, antepartum 05/26/2015  . Two  vessel umbilical cord, antepartum 05/02/2015  . Supervision of high risk pregnancy, antepartum 03/01/2015  . History of cesarean delivery, currently pregnant 03/01/2015  . Request for sterilization 01/27/2015  . Adult BMI 30+ 01/27/2015   #1: Appreciate eclampsia with questionably severe features. Patient presents to the ER with blood pressures that have been elevated and has a history of blood pressure elevation on 8/2 140s. Pressures in the ER have been elevated as high as 150/80. CBC and CMP are within normal limits but protein and creatinine ratio spot test is 0.3. Patient is not currently on any medications. She did receive steroids on 8/1 and 8/2 this week. We'll admit to antepartum unit and monitor blood pressure. We will repeat preeclampsia labs and check a 24-hour urine tomorrow.  #2: Headache unclear etiology. Patient does have a history of headache however this headache is significantly different than her previous. We will attempt treatment however if patient has headache that is resistant to treatment she would likely classify as severe preeclampsia and need delivered. If patient's headache does not improve with migraine cocktail would consider use of narcotic medications . #3: Cesarean section history: Patient had a C-section for failure to progress. She wishes to have a repeat C-section at this time.  #4: Fetal well-being: Nonstress test is normal and reactive at this time. Patient will get every shift NSTs.  #5:2 vessel umbilical cord patient had recent ultrasound which revealed normal growth. No further concerns. On this ultrasound ventricles also had returned to normal size and there is no concern. No further ultrasounds were recommended.  Admit to Antenatal Routine antenatal care  Ernestina PennaNicholas Cohan Stipes, MD Faculty Practice, [facility]

## 2015-08-11 DIAGNOSIS — O34219 Maternal care for unspecified type scar from previous cesarean delivery: Secondary | ICD-10-CM

## 2015-08-11 DIAGNOSIS — Z833 Family history of diabetes mellitus: Secondary | ICD-10-CM | POA: Diagnosis not present

## 2015-08-11 DIAGNOSIS — Z8249 Family history of ischemic heart disease and other diseases of the circulatory system: Secondary | ICD-10-CM | POA: Diagnosis not present

## 2015-08-11 DIAGNOSIS — O99213 Obesity complicating pregnancy, third trimester: Secondary | ICD-10-CM | POA: Diagnosis present

## 2015-08-11 DIAGNOSIS — Z3A34 34 weeks gestation of pregnancy: Secondary | ICD-10-CM | POA: Diagnosis not present

## 2015-08-11 DIAGNOSIS — Z825 Family history of asthma and other chronic lower respiratory diseases: Secondary | ICD-10-CM | POA: Diagnosis not present

## 2015-08-11 DIAGNOSIS — E669 Obesity, unspecified: Secondary | ICD-10-CM | POA: Diagnosis present

## 2015-08-11 DIAGNOSIS — O1403 Mild to moderate pre-eclampsia, third trimester: Secondary | ICD-10-CM | POA: Diagnosis present

## 2015-08-11 DIAGNOSIS — O1493 Unspecified pre-eclampsia, third trimester: Secondary | ICD-10-CM | POA: Diagnosis not present

## 2015-08-11 LAB — CREATININE CLEARANCE, URINE, 24 HOUR
COLLECTION INTERVAL-CRCL: 24 h
CREATININE, URINE: 56.03 mg/dL
Creatinine Clearance: 239 mL/min — ABNORMAL HIGH (ref 75–115)
Creatinine, 24H Ur: 1653 mg/d (ref 600–1800)
Urine Total Volume-CRCL: 2950 mL

## 2015-08-11 LAB — CBC
HEMATOCRIT: 35.7 % — AB (ref 36.0–46.0)
Hemoglobin: 12.2 g/dL (ref 12.0–15.0)
MCH: 29.8 pg (ref 26.0–34.0)
MCHC: 34.2 g/dL (ref 30.0–36.0)
MCV: 87.1 fL (ref 78.0–100.0)
Platelets: 252 10*3/uL (ref 150–400)
RBC: 4.1 MIL/uL (ref 3.87–5.11)
RDW: 14 % (ref 11.5–15.5)
WBC: 20.2 10*3/uL — AB (ref 4.0–10.5)

## 2015-08-11 LAB — COMPREHENSIVE METABOLIC PANEL
ALT: 9 U/L — ABNORMAL LOW (ref 14–54)
AST: 16 U/L (ref 15–41)
Albumin: 2.6 g/dL — ABNORMAL LOW (ref 3.5–5.0)
Alkaline Phosphatase: 81 U/L (ref 38–126)
Anion gap: 10 (ref 5–15)
BUN: 8 mg/dL (ref 6–20)
CHLORIDE: 108 mmol/L (ref 101–111)
CO2: 17 mmol/L — AB (ref 22–32)
Calcium: 8.6 mg/dL — ABNORMAL LOW (ref 8.9–10.3)
Creatinine, Ser: 0.5 mg/dL (ref 0.44–1.00)
Glucose, Bld: 163 mg/dL — ABNORMAL HIGH (ref 65–99)
POTASSIUM: 4.1 mmol/L (ref 3.5–5.1)
SODIUM: 135 mmol/L (ref 135–145)
Total Bilirubin: 0.2 mg/dL — ABNORMAL LOW (ref 0.3–1.2)
Total Protein: 6.3 g/dL — ABNORMAL LOW (ref 6.5–8.1)

## 2015-08-11 LAB — PROTEIN, URINE, 24 HOUR
Collection Interval-UPROT: 24 hours
PROTEIN, 24H URINE: 472 mg/d — AB (ref 50–100)
PROTEIN, URINE: 16 mg/dL
Urine Total Volume-UPROT: 2950 mL

## 2015-08-11 MED ORDER — LACTATED RINGERS IV SOLN
INTRAVENOUS | Status: DC
  Administered 2015-08-11 – 2015-08-12 (×4): via INTRAVENOUS

## 2015-08-11 NOTE — Progress Notes (Signed)
Patient ID: Sandra Fuller, female   DOB: October 11, 1985, 30 y.o.   MRN: [id number] FACULTY PRACTICE ANTEPARTUM(COMPREHENSIVE) NOTE  Sandra Fuller is a 30 y.o. G3P1011 at 5154w2d  who is admitted for preeclampsia with severe features.    Fetal presentation is unsure. Length of Stay:  0  Days  Date of admission:08/10/2015  Subjective: Patient reports the persistent presence of a right sided headache which has significantly improved since admission. She denies blurry vision, ruq/epigastric pain. She reports good fetal movement. She is hoping to be delivered today as she has a 347 yr old daughter to care for at home. She doesn't want to be discharged home to only have to return to the hospital again. Patient reports the fetal movement as active. Patient reports uterine contraction  activity as irregular. Patient reports  vaginal bleeding as none. Patient describes fluid per vagina as None.  Vitals:  Blood pressure 131/66, pulse (!) 110, temperature 97.5 F (36.4 C), temperature source Oral, resp. rate 16, height 5\' 6"  (1.676 m), weight 254 lb (115.2 kg), last menstrual period 12/14/2014, SpO2 99 %. Vitals:   08/11/15 0020 08/11/15 0200 08/11/15 0505 08/11/15 0600  BP:   131/66   Pulse:   (!) 110   Resp: 18 16 16 16   Temp:   97.5 F (36.4 C)   TempSrc:   Oral   SpO2:      Weight:      Height:       Physical Examination:  General appearance - alert, well appearing, and in no distress Fundal Height:  size equals dates Pelvic Exam:  examination not indicated Cervical Exam: Not evaluated.  Extremities: extremities normal, atraumatic, no cyanosis or edema with DTRs 2+ bilaterally Membranes:intact  Fetal Monitoring:  Baseline: 130 bpm, Variability: Good {> 6 bpm), Accelerations: Reactive and Decelerations: Absent     Labs:  Results for orders placed or performed during the hospital encounter of 08/10/15 (from the past 24 hour(s))  Urinalysis, Routine w reflex microscopic ([facility])   Collection Time: 08/10/15  5:30 PM  Result Value Ref Range   Color, Urine YELLOW YELLOW   APPearance CLEAR CLEAR   Specific Gravity, Urine 1.010 1.005 - 1.030   pH 7.0 5.0 - 8.0   Glucose, UA NEGATIVE NEGATIVE mg/dL   Hgb urine dipstick NEGATIVE NEGATIVE   Bilirubin Urine NEGATIVE NEGATIVE   Ketones, ur NEGATIVE NEGATIVE mg/dL   Protein, ur NEGATIVE NEGATIVE mg/dL   Nitrite NEGATIVE NEGATIVE   Leukocytes, UA NEGATIVE NEGATIVE  Protein / creatinine ratio, urine   Collection Time: 08/10/15  5:30 PM  Result Value Ref Range   Creatinine, Urine 46.00 mg/dL   Total Protein, Urine 14 mg/dL   Protein Creatinine Ratio 0.30 (H) 0.00 - 0.15 mg/mg[Cre]  Comprehensive metabolic panel   Collection Time: 08/10/15  5:38 PM  Result Value Ref Range   Sodium 134 (L) 135 - 145 mmol/L   Potassium 3.7 3.5 - 5.1 mmol/L   Chloride 103 101 - 111 mmol/L   CO2 21 (L) 22 - 32 mmol/L   Glucose, Bld 83 65 - 99 mg/dL   BUN 8 6 - 20 mg/dL   Creatinine, Ser 4.090.48 0.44 - 1.00 mg/dL   Calcium 9.1 8.9 - 81.110.3 mg/dL   Total Protein 6.7 6.5 - 8.1 g/dL   Albumin 2.9 (L) 3.5 - 5.0 g/dL   AST 14 (L) 15 - 41 U/L   ALT 9 (L) 14 - 54 U/L   Alkaline Phosphatase 87  38 - 126 U/L   Total Bilirubin 0.2 (L) 0.3 - 1.2 mg/dL   GFR calc non Af Amer >60 >60 mL/min   GFR calc Af Amer >60 >60 mL/min   Anion gap 10 5 - 15  CBC   Collection Time: 08/10/15  5:38 PM  Result Value Ref Range   WBC 15.4 (H) 4.0 - 10.5 K/uL   RBC 4.43 3.87 - 5.11 MIL/uL   Hemoglobin 13.2 12.0 - 15.0 g/dL   HCT 53.6 64.4 - 03.4 %   MCV 86.7 78.0 - 100.0 fL   MCH 29.8 26.0 - 34.0 pg   MCHC 34.4 30.0 - 36.0 g/dL   RDW 74.2 59.5 - 63.8 %   Platelets 225 150 - 400 K/uL  Wet prep, genital   Collection Time: 08/10/15  5:50 PM  Result Value Ref Range   Yeast Wet Prep HPF POC NONE SEEN NONE SEEN   Trich, Wet Prep NONE SEEN NONE SEEN   Clue Cells Wet Prep HPF POC NONE SEEN NONE SEEN   WBC, Wet Prep HPF POC FEW (A) NONE SEEN   Sperm NONE SEEN   Type  and screen [facility]   Collection Time: 08/10/15  7:05 PM  Result Value Ref Range   ABO/RH(D) O POS    Antibody Screen NEG    Sample Expiration 08/13/2015   ABO/Rh   Collection Time: 08/10/15  7:05 PM  Result Value Ref Range   ABO/RH(D) O POS   CBC   Collection Time: 08/11/15  5:48 AM  Result Value Ref Range   WBC 20.2 (H) 4.0 - 10.5 K/uL   RBC 4.10 3.87 - 5.11 MIL/uL   Hemoglobin 12.2 12.0 - 15.0 g/dL   HCT 75.6 (L) 43.3 - 29.5 %   MCV 87.1 78.0 - 100.0 fL   MCH 29.8 26.0 - 34.0 pg   MCHC 34.2 30.0 - 36.0 g/dL   RDW 18.8 41.6 - 60.6 %   Platelets 252 150 - 400 K/uL    Imaging Studies:    Currently EPIC will not allow sonographic studies to automatically populate into notes.  In the meantime, copy and paste results into note or free text.  Medications:  Scheduled . docusate sodium  100 mg Oral Daily  . prenatal multivitamin  1 tablet Oral Q1200   I have reviewed the patient's current medications.  ASSESSMENT: [id number] [redacted]w[redacted]d Estimated Date of Delivery: 09/20/15  Patient Active Problem List   Diagnosis Date Noted  . Mild pre-eclampsia 08/10/2015  . Gestational hypertension w/o significant proteinuria in 3rd trimester 08/04/2015  . 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 Obesity in pregnancy, antepartum 05/26/2015  . Two vessel umbilical cord, antepartum 05/02/2015  . Supervision of high risk pregnancy, antepartum 03/01/2015  . History of cesarean delivery, currently pregnant 03/01/2015  . Request for sterilization 01/27/2015  . Adult BMI 30+ 01/27/2015    PLAN: 30 yo G3P1011 at [redacted]w[redacted]d with preeclampsia with severe features - Continue inpatient monitoring. - NPO today in the event of possible delivery de to worsening disease - Follow up 24 hr urine collection for protein which is due to be completed at 1930 - Patient with normal growth (EFW 78%tile) and fluid (AFI 13) on 8/3  - Patient completed BMX on  8/1 and 8/2 - Follow up AM labs - Continue current antepartum care  Annice Jolly 08/11/2015,6:49 AM

## 2015-08-12 ENCOUNTER — Other Ambulatory Visit: Payer: Medicaid Other

## 2015-08-12 MED ORDER — LABETALOL HCL 100 MG PO TABS: 100.0000 mg | ORAL_TABLET | Freq: Two times a day (BID) | ORAL | 0 refills | Status: DC

## 2015-08-12 MED ORDER — DOCUSATE SODIUM 100 MG PO CAPS: 100.0000 mg | ORAL_CAPSULE | Freq: Every day | ORAL | 0 refills | Status: DC

## 2015-08-12 MED ORDER — LABETALOL HCL 100 MG PO TABS
100.0000 mg | ORAL_TABLET | Freq: Two times a day (BID) | ORAL | Status: DC
  Administered 2015-08-12: 100 mg via ORAL
  Filled 2015-08-12: qty 1

## 2015-08-12 NOTE — Progress Notes (Signed)
Patient ID: Sandra Fuller, female   DOB: [birth year], 30 y.o.   MRN: [id number] FACULTY PRACTICE ANTEPARTUM(COMPREHENSIVE) NOTE  Sandra Rifeshley D Cena is a 30 y.o. G3P1011 at 7729w3d  who is admitted for eval due to high bp at home, with eval showing borderline Pr/Cr 0.30 now with 24 hr TP  464mg /d Fetal presentation is cephalic. Length of Stay:  1  Days  Subjective: Headache is completely resolved.  Pt is [facility] m[facility] Patient reports the fetal movement as active. Patient reports uterine contraction  activity as none. Patient reports  vaginal bleeding as none. Patient describes fluid per vagina as None.  Vitals:  Blood pressure (!) 143/67, pulse 82, temperature 98.3 F (36.8 C), temperature source Oral, resp. rate 18, height 5\' 6"  (1.676 m), weight 112.4 kg (247 lb 11.2 oz), last menstrual period 12/14/2014, SpO2 99 %. Physical Examination:  General appearance - alert, well appearing, and in no distress, oriented to person, place, and time and overweight Heart - normal rate and regular rhythm Abdomen - soft, nontender, nondistended Fundal Height:  size equals dates Cervical Exam: Not evaluated. and found to be not evaluated/ / and fetal presentation is . Extremities: extremities normal, atraumatic, no cyanosis or edema and Homans sign is negative, no sign of DVT with DTRs 2+ bilaterally Membranes:intact  Fetal Monitoring:  Baseline: 135 bpm and Variability: Good {> 6 bpm)  Labs:  No results found for this or any previous visit (from the past 24 hour(s)).  Imaging Studies:     Currently EPIC will not allow sonographic studies to automatically populate into notes.  In the meantime, copy and paste results into note or free text.  Medications:  Scheduled . docusate sodium  100 mg Oral Daily  . prenatal multivitamin  1 tablet Oral Q1200   I have reviewed the patient's current medications. Will add labetalol 100 bid at d/c ASSESSMENT: Patient  Active Problem List   Diagnosis Date Noted  . Mild pre-eclampsia 08/10/2015  . Gestational hypertension w/o significant proteinuria in 3rd trimester 08/04/2015  . 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 Obesity in pregnancy, antepartum 05/26/2015  . Two vessel umbilical cord, antepartum 05/02/2015  . Supervision of high risk pregnancy, antepartum 03/01/2015  . History of cesarean delivery, currently pregnant 03/01/2015  . Request for sterilization 01/27/2015  . Adult BMI 30+ 01/27/2015    PLAN: Pt stable for outpt tx, will begin labetalol 100 bid, and [facility]  Hank Walling V 08/12/2015,7:27 AM

## 2015-08-12 NOTE — Discharge Instructions (Signed)
Begin labetalol 100 mg twice daily Keep twice weekly nonstress tests at [facility] beginning next Monday [facility] the need for rescheduling her cesarean section at 37 weeks  Hypertension During Pregnancy Hypertension is also called high blood pressure. Blood pressure moves blood in your body. Sometimes, the force that moves the blood becomes too strong. When you are pregnant, this condition should be watched carefully. It can cause problems for you and your baby. HOME CARE   Make and keep all of your doctor visits.  Take medicine as told by your doctor. Tell your doctor about all medicines you take.  Eat very little salt.  Exercise regularly.  Do not drink alcohol.  Do not smoke.  Do not have drinks with caffeine.  Lie on your left side when resting.  Your health care provider may ask you to take one low-dose aspirin (81mg ) each day. GET HELP RIGHT AWAY IF:  You have bad belly (abdominal) pain.  You have sudden puffiness (swelling) in the hands, ankles, or face.  You gain 4 pounds (1.8 kilograms) or more in 1 week.  You throw up (vomit) repeatedly.  You have bleeding from the vagina.  You do not feel the baby moving as much.  You have a headache.  You have blurred or double vision.  You have muscle twitching or spasms.  You have shortness of breath.  You have blue fingernails and lips.  You have blood in your pee (urine). MAKE SURE YOU:  Understand these instructions.  Will watch your condition.  Will get help right away if you are not doing well or get worse.   This information is not intended to replace advice given to you by your health care provider. Make sure you discuss any questions you have with your health care provider.   Document Released: 01/21/2010 Document Revised: 01/09/2014 Document Reviewed: 07/18/2012 Elsevier Interactive Patient Education [employer]2016 Elsevier Inc.

## 2015-08-12 NOTE — Progress Notes (Signed)
Discharge information reviewed with patient and she verbalizes understanding. Pre-E  And PTL s/s reviewed as well, pt stating that she understands.

## 2015-08-12 NOTE — Discharge Summary (Signed)
Antenatal Physician Discharge Summary  Patient ID: Sandra Fuller MRN: [id number] DOB/AGE: 1985-09-25 30 y.o.  Admit date: 08/10/2015 Discharge date: 08/12/2015  Admission Diagnoses:Pregnancy [redacted] weeks gestational hypertension versus preeclampsia rule out severe features,  headache in pregnancy  Discharge Diagnoses: Pregnancy 34 weeks 3 days mild preeclampsia                                         Resolved headache  Antenatal Unit Procedures: NST and ultrasound  Intrapartum Procedures:    Significant Diagnostic Studies:  Results for orders placed or performed during the hospital encounter of 08/10/15 (from the past 168 hour(s))  Urinalysis, Routine w reflex microscopic (not at [facility])   Collection Time: 08/10/15  5:30 PM  Result Value Ref Range   Color, Urine YELLOW YELLOW   APPearance CLEAR CLEAR   Specific Gravity, Urine 1.010 1.005 - 1.030   pH 7.0 5.0 - 8.0   Glucose, UA NEGATIVE NEGATIVE mg/dL   Hgb urine dipstick NEGATIVE NEGATIVE   Bilirubin Urine NEGATIVE NEGATIVE   Ketones, ur NEGATIVE NEGATIVE mg/dL   Protein, ur NEGATIVE NEGATIVE mg/dL   Nitrite NEGATIVE NEGATIVE   Leukocytes, UA NEGATIVE NEGATIVE  Protein / creatinine ratio, urine   Collection Time: 08/10/15  5:30 PM  Result Value Ref Range   Creatinine, Urine 46.00 mg/dL   Total Protein, Urine 14 mg/dL   Protein Creatinine Ratio 0.30 (H) 0.00 - 0.15 mg/mg[Cre]  Comprehensive metabolic panel   Collection Time: 08/10/15  5:38 PM  Result Value Ref Range   Sodium 134 (L) 135 - 145 mmol/L   Potassium 3.7 3.5 - 5.1 mmol/L   Chloride 103 101 - 111 mmol/L   CO2 21 (L) 22 - 32 mmol/L   Glucose, Bld 83 65 - 99 mg/dL   BUN 8 6 - 20 mg/dL   Creatinine, Ser 5.36 0.44 - 1.00 mg/dL   Calcium 9.1 8.9 - 64.4 mg/dL   Total Protein 6.7 6.5 - 8.1 g/dL   Albumin 2.9 (L) 3.5 - 5.0 g/dL   AST 14 (L) 15 - 41 U/L   ALT 9 (L) 14 - 54 U/L   Alkaline Phosphatase 87 38 - 126 U/L   Total Bilirubin 0.2 (L) 0.3 - 1.2 mg/dL   GFR  calc non Af Amer >60 >60 mL/min   GFR calc Af Amer >60 >60 mL/min   Anion gap 10 5 - 15  CBC   Collection Time: 08/10/15  5:38 PM  Result Value Ref Range   WBC 15.4 (H) 4.0 - 10.5 K/uL   RBC 4.43 3.87 - 5.11 MIL/uL   Hemoglobin 13.2 12.0 - 15.0 g/dL   HCT 03.4 74.2 - 59.5 %   MCV 86.7 78.0 - 100.0 fL   MCH 29.8 26.0 - 34.0 pg   MCHC 34.4 30.0 - 36.0 g/dL   RDW 63.8 75.6 - 43.3 %   Platelets 225 150 - 400 K/uL  Wet prep, genital   Collection Time: 08/10/15  5:50 PM  Result Value Ref Range   Yeast Wet Prep HPF POC NONE SEEN NONE SEEN   Trich, Wet Prep NONE SEEN NONE SEEN   Clue Cells Wet Prep HPF POC NONE SEEN NONE SEEN   WBC, Wet Prep HPF POC FEW (A) NONE SEEN   Sperm NONE SEEN   Type and screen [facility]   Collection Time: 08/10/15  7:05  PM  Result Value Ref Range   ABO/RH(D) O POS    Antibody Screen NEG    Sample Expiration 08/13/2015   ABO/Rh   Collection Time: 08/10/15  7:05 PM  Result Value Ref Range   ABO/RH(D) O POS   Protein, urine, 24 hour   Collection Time: 08/10/15  7:30 PM  Result Value Ref Range   Urine Total Volume-UPROT 2,950 mL   Collection Interval-UPROT 24 hours   Protein, Urine 16 mg/dL   Protein, 96E Urine 454 (H) 50 - 100 mg/day  Creatinine clearance, urine, 24 hour   Collection Time: 08/10/15  7:30 PM  Result Value Ref Range   Urine Total Volume-CRCL 2,950 mL   Collection Interval-CRCL 24 hours   Creatinine, Urine 56.03 mg/dL   Creatinine, 09W Ur 1,191 600 - 1,800 mg/day   Creatinine Clearance 239 (H) 75 - 115 mL/min  Comprehensive metabolic panel   Collection Time: 08/11/15  5:48 AM  Result Value Ref Range   Sodium 135 135 - 145 mmol/L   Potassium 4.1 3.5 - 5.1 mmol/L   Chloride 108 101 - 111 mmol/L   CO2 17 (L) 22 - 32 mmol/L   Glucose, Bld 163 (H) 65 - 99 mg/dL   BUN 8 6 - 20 mg/dL   Creatinine, Ser 4.78 0.44 - 1.00 mg/dL   Calcium 8.6 (L) 8.9 - 10.3 mg/dL   Total Protein 6.3 (L) 6.5 - 8.1 g/dL   Albumin 2.6 (L)  3.5 - 5.0 g/dL   AST 16 15 - 41 U/L   ALT 9 (L) 14 - 54 U/L   Alkaline Phosphatase 81 38 - 126 U/L   Total Bilirubin 0.2 (L) 0.3 - 1.2 mg/dL   GFR calc non Af Amer >60 >60 mL/min   GFR calc Af Amer >60 >60 mL/min   Anion gap 10 5 - 15  CBC   Collection Time: 08/11/15  5:48 AM  Result Value Ref Range   WBC 20.2 (H) 4.0 - 10.5 K/uL   RBC 4.10 3.87 - 5.11 MIL/uL   Hemoglobin 12.2 12.0 - 15.0 g/dL   HCT 29.5 (L) 62.1 - 30.8 %   MCV 87.1 78.0 - 100.0 fL   MCH 29.8 26.0 - 34.0 pg   MCHC 34.2 30.0 - 36.0 g/dL   RDW 65.7 84.6 - 96.2 %   Platelets 252 150 - 400 K/uL    Treatments: steroids: Betamethasone 2  Hospital Course:  This is a 30 y.o. G3P1011 with IUP at [redacted]w[redacted]d admitted for elevated blood pressures with headache in order to rule out severe preeclampsia. She was admitted with pressures approximately 140-150/70-90, noted to have a cervical exam of closed.  No leaking of fluid and no bleeding.  She was initially started on  She has received magnesium sulfate for  neuroprotection and  also received betamethasone x 2 doses.      She was showed protein creatinine ratio 0.30 and 24 urine showed 464 mg per day proteinuria headache resolved within a day is being admitted and did not recur. Blood pressures stabilized above 140/80   She was deemed stable for discharge to home with outpatient follow up twice weekly testing through [facility] clinic beginning next Monday, along with initiation of labetalol 100 mg twice a day  Discharge Exam: BP 140/80 (BP Location: Right Arm)   Pulse 81   Temp 98.2 F (36.8 C) (Oral)   Resp 17   Ht 5\' 6"  (1.676 m)   Wt 112.4 kg (247 lb  11.2 oz)   LMP 12/14/2014   SpO2 97%   BMI 39.98 kg/m  General appearance: alert, cooperative and no distress Head: Normocephalic, without obvious abnormality, atraumatic Resp: clear to auscultation bilaterally GI: Gravid uterus consistent with dates  Discharge Condition: Stable  Disposition: 01-Home or Self  Care  Discharge Instructions    Call MD for:    Complete by:  As directed   Call for any recurrence of the severe headache   Diet - low sodium heart healthy    Complete by:  As directed   Increase activity slowly    Complete by:  As directed       Medication List    TAKE these medications   docusate sodium 100 MG capsule Commonly known as:  COLACE Take 1 capsule (100 mg total) by mouth daily.   labetalol 100 MG tablet Commonly known as:  NORMODYNE Take 1 tablet (100 mg total) by mouth 2 (two) times daily.      Follow-up Information    ANYANWU,UGONNA A, MD Follow up in 4 day(s).   Specialty:  Obstetrics and Gynecology Contact information: [address] [place] [address] [phone]           Signed: Tilda BurrowFERGUSON,Linsy Ehresman V M.D. 08/12/2015, 8:44 AM

## 2015-08-16 ENCOUNTER — Ambulatory Visit (INDEPENDENT_AMBULATORY_CARE_PROVIDER_SITE_OTHER): Payer: Medicaid Other | Admitting: Obstetrics & Gynecology

## 2015-08-16 ENCOUNTER — Encounter: Payer: Self-pay | Admitting: Obstetrics & Gynecology

## 2015-08-16 ENCOUNTER — Encounter (HOSPITAL_COMMUNITY): Payer: Self-pay | Admitting: *Deleted

## 2015-08-16 ENCOUNTER — Inpatient Hospital Stay (HOSPITAL_COMMUNITY)
Admission: AD | Admit: 2015-08-16 | Discharge: 2015-08-19 | DRG: 766 | Disposition: A | Discharge status: Home / Self Care | Payer: Medicaid Other | Source: Ambulatory Visit | Attending: Family Medicine | Admitting: Family Medicine

## 2015-08-16 VITALS — BP 136/82 | HR 106 | Wt 243.0 lb

## 2015-08-16 DIAGNOSIS — Z98891 History of uterine scar from previous surgery: Secondary | ICD-10-CM

## 2015-08-16 DIAGNOSIS — O99213 Obesity complicating pregnancy, third trimester: Secondary | ICD-10-CM | POA: Diagnosis not present

## 2015-08-16 DIAGNOSIS — O1493 Unspecified pre-eclampsia, third trimester: Secondary | ICD-10-CM

## 2015-08-16 DIAGNOSIS — Z8249 Family history of ischemic heart disease and other diseases of the circulatory system: Secondary | ICD-10-CM

## 2015-08-16 DIAGNOSIS — E668 Other obesity: Secondary | ICD-10-CM | POA: Diagnosis not present

## 2015-08-16 DIAGNOSIS — IMO0002 Reserved for concepts with insufficient information to code with codable children: Secondary | ICD-10-CM

## 2015-08-16 DIAGNOSIS — Z8669 Personal history of other diseases of the nervous system and sense organs: Secondary | ICD-10-CM

## 2015-08-16 DIAGNOSIS — Z302 Encounter for sterilization: Secondary | ICD-10-CM

## 2015-08-16 DIAGNOSIS — O34211 Maternal care for low transverse scar from previous cesarean delivery: Secondary | ICD-10-CM | POA: Diagnosis present

## 2015-08-16 DIAGNOSIS — O1414 Severe pre-eclampsia complicating childbirth: Principal | ICD-10-CM | POA: Diagnosis present

## 2015-08-16 DIAGNOSIS — O0993 Supervision of high risk pregnancy, unspecified, third trimester: Secondary | ICD-10-CM

## 2015-08-16 DIAGNOSIS — Z3A35 35 weeks gestation of pregnancy: Secondary | ICD-10-CM

## 2015-08-16 DIAGNOSIS — O99214 Obesity complicating childbirth: Secondary | ICD-10-CM | POA: Diagnosis present

## 2015-08-16 DIAGNOSIS — Z6839 Body mass index (BMI) 39.0-39.9, adult: Secondary | ICD-10-CM

## 2015-08-16 DIAGNOSIS — Z8759 Personal history of other complications of pregnancy, childbirth and the puerperium: Secondary | ICD-10-CM

## 2015-08-16 DIAGNOSIS — O139 Gestational [pregnancy-induced] hypertension without significant proteinuria, unspecified trimester: Secondary | ICD-10-CM | POA: Diagnosis present

## 2015-08-16 DIAGNOSIS — O133 Gestational [pregnancy-induced] hypertension without significant proteinuria, third trimester: Secondary | ICD-10-CM

## 2015-08-16 DIAGNOSIS — Z833 Family history of diabetes mellitus: Secondary | ICD-10-CM

## 2015-08-16 LAB — URINALYSIS, ROUTINE W REFLEX MICROSCOPIC
BILIRUBIN URINE: NEGATIVE
Glucose, UA: NEGATIVE mg/dL
HGB URINE DIPSTICK: NEGATIVE
KETONES UR: 15 mg/dL — AB
Leukocytes, UA: NEGATIVE
Nitrite: NEGATIVE
PH: 7 (ref 5.0–8.0)
Protein, ur: NEGATIVE mg/dL
SPECIFIC GRAVITY, URINE: 1.02 (ref 1.005–1.030)

## 2015-08-16 LAB — CBC
HCT: 38.9 % (ref 36.0–46.0)
Hemoglobin: 13.1 g/dL (ref 12.0–15.0)
MCH: 29.6 pg (ref 26.0–34.0)
MCHC: 33.7 g/dL (ref 30.0–36.0)
MCV: 87.8 fL (ref 78.0–100.0)
PLATELETS: 230 10*3/uL (ref 150–400)
RBC: 4.43 MIL/uL (ref 3.87–5.11)
RDW: 14.5 % (ref 11.5–15.5)
WBC: 15.9 10*3/uL — AB (ref 4.0–10.5)

## 2015-08-16 LAB — PROTEIN / CREATININE RATIO, URINE
CREATININE, URINE: 115 mg/dL
Protein Creatinine Ratio: 0.27 mg/mg{Cre} — ABNORMAL HIGH (ref 0.00–0.15)
TOTAL PROTEIN, URINE: 31 mg/dL

## 2015-08-16 LAB — COMPREHENSIVE METABOLIC PANEL
ALT: 10 U/L — ABNORMAL LOW (ref 14–54)
AST: 15 U/L (ref 15–41)
Albumin: 2.8 g/dL — ABNORMAL LOW (ref 3.5–5.0)
Alkaline Phosphatase: 99 U/L (ref 38–126)
Anion gap: 8 (ref 5–15)
BUN: 6 mg/dL (ref 6–20)
CHLORIDE: 107 mmol/L (ref 101–111)
CO2: 23 mmol/L (ref 22–32)
CREATININE: 0.39 mg/dL — AB (ref 0.44–1.00)
Calcium: 9 mg/dL (ref 8.9–10.3)
Glucose, Bld: 89 mg/dL (ref 65–99)
POTASSIUM: 4.3 mmol/L (ref 3.5–5.1)
SODIUM: 138 mmol/L (ref 135–145)
TOTAL PROTEIN: 6.1 g/dL — AB (ref 6.5–8.1)
Total Bilirubin: 0.6 mg/dL (ref 0.3–1.2)

## 2015-08-16 LAB — TYPE AND SCREEN
ABO/RH(D): O POS
Antibody Screen: NEGATIVE

## 2015-08-16 MED ORDER — PRENATAL MULTIVITAMIN CH
1.0000 | ORAL_TABLET | Freq: Every day | ORAL | Status: DC
  Filled 2015-08-16: qty 1

## 2015-08-16 MED ORDER — LABETALOL HCL 100 MG PO TABS
100.0000 mg | ORAL_TABLET | Freq: Two times a day (BID) | ORAL | Status: DC
  Administered 2015-08-16 – 2015-08-19 (×5): 100 mg via ORAL
  Filled 2015-08-16 (×5): qty 1

## 2015-08-16 MED ORDER — CALCIUM CARBONATE ANTACID 500 MG PO CHEW: 2.0000 | CHEWABLE_TABLET | ORAL | Status: DC | PRN

## 2015-08-16 MED ORDER — DOCUSATE SODIUM 100 MG PO CAPS
100.0000 mg | ORAL_CAPSULE | Freq: Every day | ORAL | Status: DC
  Administered 2015-08-18 – 2015-08-19 (×2): 100 mg via ORAL
  Filled 2015-08-16 (×2): qty 1

## 2015-08-16 MED ORDER — ZOLPIDEM TARTRATE 5 MG PO TABS: 5.0000 mg | ORAL_TABLET | Freq: Every evening | ORAL | Status: DC | PRN

## 2015-08-16 MED ORDER — BUTALBITAL-APAP-CAFFEINE 50-325-40 MG PO TABS
2.0000 | ORAL_TABLET | ORAL | Status: DC | PRN
Start: 2015-08-16 — End: 2015-08-19
  Administered 2015-08-16: 2 via ORAL
  Filled 2015-08-16: qty 2

## 2015-08-16 NOTE — Progress Notes (Signed)
Subjective:  Sandra Fuller is a 30 y.o. G3P1011 at 6257w0d being seen today for ongoing prenatal care.  She is currently monitored for the following issues for this high-risk pregnancy and has Supervision of high risk pregnancy, antepartum; History of cesarean delivery, currently pregnant; Two vessel umbilical cord, antepartum; Obesity in pregnancy, antepartum; Suspected fetal cerebral ventriculomegaly with prominent left ventricle; Request for sterilization; Adult BMI 30+; Gestational hypertension w/o significant proteinuria in 3rd trimester; and Mild pre-eclampsia on her problem list.  Patient reports carpal tunnel symptoms and headache.  Contractions: Irregular. Vag. Bleeding: None.  Movement: Present. Denies leaking of fluid.   The following portions of the patient's history were reviewed and updated as appropriate: allergies, current medications, past family history, past medical history, past social history, past surgical history and problem list. Problem list updated.  Objective:   Vitals:   08/16/15 1309 08/16/15 1322  BP: (!) 158/100 136/82  Pulse: (!) 106 (!) 106  Weight: 243 lb (110.2 kg)     Fetal Status:   Fundal Height: 37 cm Movement: Present     General:  Alert, oriented and cooperative. Patient is in no acute distress.  Skin: Skin is warm and dry. No rash noted.   Cardiovascular: Normal heart rate noted  Respiratory: Normal respiratory effort, no problems with respiration noted  Abdomen: Soft, gravid, appropriate for gestational age. Pain/Pressure: Present     Pelvic:  Cervical exam deferred        Extremities: Normal range of motion.  Edema: Trace  Mental Status: Normal mood and affect. Normal behavior. Normal judgment and thought content.   Urinalysis: Urine Protein: Trace Urine Glucose: Negative  Assessment and Plan:  Pregnancy: G3P1011 at 557w0d with headache patient considers severe and persistent.  Pt has history of headache and was under care of neurologist.    Will send to MAU immediately for evaluation for severe pre-E.  There are no diagnoses linked to this encounter. Preterm labor symptoms and general obstetric precautions including but not limited to vaginal bleeding, contractions, leaking of fluid and fetal movement were reviewed in detail with the patient. Please refer to After Visit Summary for other counseling recommendations.  No Follow-up on file.   Lesly DukesKelly H Leggett, MD

## 2015-08-16 NOTE — Progress Notes (Signed)
Pt. states she is experiencing headaches and blurred vision. Today at 11:00 am the pt's blood pressure was 140/97 (checked at home).

## 2015-08-16 NOTE — MAU Provider Note (Signed)
Chief Complaint:  Hypertension   None     HPI: Sandra Fuller is a 30 y.o. G3P1011 at 4835w0dwho presents to maternity admissions sent from office for known preeclampsia and h/a with worsening/more persistent h/a and vision changes starting 2 days ago.  Pt reports hx of headaches and has seen neurology for this in the past. She reports h/a's changed recently.  At onset, h/a were intermittent and well controlled with Tylenol.  Last week pt was admitted to hospital and diagnosed based on symptoms/labs with preeclampsia.  H/a at that time was intermittent and responded to Tylenol.  This weekend, pt reports h/a more consistent, not necessarily more painful, but not resolving with rest, water intake, Tylenol. She also reports more frequent visual changes described as vision going blurry, then gray and taking a few minutes to return completely. She denies epigastric pain.  She is taking labetalol 100 mg PO BID as prescribed last week. She reports good fetal movement, denies regular contractions, LOF, vaginal bleeding, vaginal itching/burning, urinary symptoms, dizziness, n/v, or fever/chills.    HPI  Past Medical History: Past Medical History:  Diagnosis Date  . Headache    Migraines  . Hypertension     Past obstetric history: OB History  Gravida Para Term Preterm AB Living  3 1 1   1 1   SAB TAB Ectopic Multiple Live Births  1       1    # Outcome Date GA Lbr Len/2nd Weight Sex Delivery Anes PTL Lv  3 Current           2 Term 10/31/07   6 lb (2.722 kg) F CS-LTranv   LIV     Complications: Failure to Progress in First Stage,Two vessel umbilical cord, antepartum, fetus 2  1 SAB               Past Surgical History: Past Surgical History:  Procedure Laterality Date  . ARTHROSCOPIC REPAIR ACL Bilateral   . CESAREAN SECTION    . NOSE SURGERY     broken nose  . WISDOM TOOTH EXTRACTION      Family History: Family History  Problem Relation Age of Onset  . Hypertension Mother   .  Diabetes Mother     gestational  . Preterm labor Mother   . Fibroids Mother   . Hypertension Father   . Heart disease Father   . Cancer - Lung Paternal Grandmother   . ADD / ADHD Daughter   . Asthma Daughter     Social History: Social History  Substance Use Topics  . Smoking status: Never Smoker  . Smokeless tobacco: Never Used  . Alcohol use No     Comment: occassionally; not now    Allergies:  Allergies  Allergen Reactions  . Adhesive [Tape] Rash    Meds:  Prescriptions Prior to Admission  Medication Sig Dispense Refill Last Dose  . acetaminophen (TYLENOL) 325 MG tablet Take 650 mg by mouth every 6 (six) hours as needed for headache.   08/15/2015 at Unknown time  . labetalol (NORMODYNE) 100 MG tablet Take 1 tablet (100 mg total) by mouth 2 (two) times daily. 40 tablet 0 08/16/2015 at 1000  . Prenatal Vit-Fe Fumarate-FA (PRENATAL MULTIVITAMIN) TABS tablet Take 1 tablet by mouth at bedtime.   08/15/2015 at Unknown time  . docusate sodium (COLACE) 100 MG capsule Take 1 capsule (100 mg total) by mouth daily. (Patient not taking: Reported on 08/16/2015) 10 capsule 0 Not Taking at Unknown time  ROS:  Review of Systems  Constitutional: Negative for chills, fatigue and fever.  Eyes: Positive for visual disturbance.  Respiratory: Negative for shortness of breath.   Cardiovascular: Negative for chest pain.  Gastrointestinal: Negative for abdominal pain, nausea and vomiting.  Genitourinary: Negative for difficulty urinating, dysuria, flank pain, pelvic pain, vaginal bleeding, vaginal discharge and vaginal pain.  Neurological: Positive for headaches. Negative for dizziness.  Psychiatric/Behavioral: Negative.      I have reviewed patient's Past Medical Hx, Surgical Hx, Family Hx, Social Hx, medications and allergies.   Physical Exam   Patient Vitals for the past 24 hrs:  BP Temp Temp src Pulse Resp  08/16/15 1840 157/89 - - 98 18  08/16/15 1813 155/100 - - 104 18  08/16/15  1751 146/97 - - 110 -  08/16/15 1736 152/91 - - 105 -  08/16/15 1713 150/92 - - 100 -  08/16/15 1701 145/94 - - 104 -  08/16/15 1649 157/96 98.1 F (36.7 C) Oral 105 18   Constitutional: Well-developed, well-nourished female in no acute distress.  Cardiovascular: normal rate Respiratory: normal effort GI: Abd soft, non-tender, gravid appropriate for gestational age.  MS: Extremities nontender, no edema, normal ROM Neurologic: Alert and oriented x 4.  GU: Neg CVAT.    FHT:  Baseline 140 , moderate variability, accelerations present, no decelerations Contractions: None on toco or to palpation   Labs: Results for orders placed or performed during the hospital encounter of 08/16/15 (from the past 24 hour(s))  Protein / creatinine ratio, urine     Status: Abnormal   Collection Time: 08/16/15  4:30 PM  Result Value Ref Range   Creatinine, Urine 115.00 mg/dL   Total Protein, Urine 31 mg/dL   Protein Creatinine Ratio 0.27 (H) 0.00 - 0.15 mg/mg[Cre]  Urinalysis, Routine w reflex microscopic (not at [facility])     Status: Abnormal   Collection Time: 08/16/15  4:30 PM  Result Value Ref Range   Color, Urine YELLOW YELLOW   APPearance CLEAR CLEAR   Specific Gravity, Urine 1.020 1.005 - 1.030   pH 7.0 5.0 - 8.0   Glucose, UA NEGATIVE NEGATIVE mg/dL   Hgb urine dipstick NEGATIVE NEGATIVE   Bilirubin Urine NEGATIVE NEGATIVE   Ketones, ur 15 (A) NEGATIVE mg/dL   Protein, ur NEGATIVE NEGATIVE mg/dL   Nitrite NEGATIVE NEGATIVE   Leukocytes, UA NEGATIVE NEGATIVE  CBC     Status: Abnormal   Collection Time: 08/16/15  5:29 PM  Result Value Ref Range   WBC 15.9 (H) 4.0 - 10.5 K/uL   RBC 4.43 3.87 - 5.11 MIL/uL   Hemoglobin 13.1 12.0 - 15.0 g/dL   HCT 47.838.9 29.536.0 - 62.146.0 %   MCV 87.8 78.0 - 100.0 fL   MCH 29.6 26.0 - 34.0 pg   MCHC 33.7 30.0 - 36.0 g/dL   RDW 30.814.5 65.711.5 - 84.615.5 %   Platelets 230 150 - 400 K/uL  Comprehensive metabolic panel     Status: Abnormal   Collection Time: 08/16/15  5:29  PM  Result Value Ref Range   Sodium 138 135 - 145 mmol/L   Potassium 4.3 3.5 - 5.1 mmol/L   Chloride 107 101 - 111 mmol/L   CO2 23 22 - 32 mmol/L   Glucose, Bld 89 65 - 99 mg/dL   BUN 6 6 - 20 mg/dL   Creatinine, Ser 9.620.39 (L) 0.44 - 1.00 mg/dL   Calcium 9.0 8.9 - 95.210.3 mg/dL   Total Protein 6.1 (L) 6.5 -  8.1 g/dL   Albumin 2.8 (L) 3.5 - 5.0 g/dL   AST 15 15 - 41 U/L   ALT 10 (L) 14 - 54 U/L   Alkaline Phosphatase 99 38 - 126 U/L   Total Bilirubin 0.6 0.3 - 1.2 mg/dL   GFR calc non Af Amer >60 >60 mL/min   GFR calc Af Amer >60 >60 mL/min   Anion gap 8 5 - 15   --/--/O POS, O POS (08/08 1905)   MAU Course/MDM: I have ordered labs and reviewed results.  Consult Dr Penne Lash.  With hx of headaches and preeclampsia labs wnl, admit for observation overnight.  NPO after midnight.  Treatments in MAU included Fioricet 2 tabs PO with some improvement in pt h/a.  Pt stable at time of transfer.  Assessment: 1. Hypertension in pregnancy, preeclampsia, third trimester   2. Gestational hypertension w/o significant proteinuria in 3rd trimester   3. Obesity in pregnancy, antepartum, third trimester   4. Two vessel umbilical cord, antepartum, not applicable or unspecified fetus   5. Supervision of high risk pregnancy, antepartum, third trimester   6. History of migraine during pregnancy     Plan: Admit to antepartum Continuous EFM NPO after midnight Continue PO labetalol Notify provider if severe range BP >160/110  Sharen Counter Certified Nurse-Midwife 08/16/2015 7:01 PM

## 2015-08-16 NOTE — MAU Note (Signed)
Pt went to her appointment today and b/p was elevated. Pt c/o headache today.

## 2015-08-17 ENCOUNTER — Observation Stay (HOSPITAL_COMMUNITY): Payer: Medicaid Other | Admitting: Certified Registered Nurse Anesthetist

## 2015-08-17 ENCOUNTER — Encounter (HOSPITAL_COMMUNITY)
Admission: AD | Disposition: A | Discharge status: Home / Self Care | Payer: Self-pay | Source: Ambulatory Visit | Attending: Family Medicine

## 2015-08-17 DIAGNOSIS — O1414 Severe pre-eclampsia complicating childbirth: Secondary | ICD-10-CM | POA: Diagnosis present

## 2015-08-17 DIAGNOSIS — O99214 Obesity complicating childbirth: Secondary | ICD-10-CM | POA: Diagnosis present

## 2015-08-17 DIAGNOSIS — O139 Gestational [pregnancy-induced] hypertension without significant proteinuria, unspecified trimester: Secondary | ICD-10-CM | POA: Diagnosis present

## 2015-08-17 DIAGNOSIS — Z833 Family history of diabetes mellitus: Secondary | ICD-10-CM | POA: Diagnosis not present

## 2015-08-17 DIAGNOSIS — O34211 Maternal care for low transverse scar from previous cesarean delivery: Secondary | ICD-10-CM | POA: Diagnosis present

## 2015-08-17 DIAGNOSIS — Z98891 History of uterine scar from previous surgery: Secondary | ICD-10-CM

## 2015-08-17 DIAGNOSIS — Z3A35 35 weeks gestation of pregnancy: Secondary | ICD-10-CM

## 2015-08-17 DIAGNOSIS — Z302 Encounter for sterilization: Secondary | ICD-10-CM

## 2015-08-17 DIAGNOSIS — Z6839 Body mass index (BMI) 39.0-39.9, adult: Secondary | ICD-10-CM | POA: Diagnosis not present

## 2015-08-17 DIAGNOSIS — O1494 Unspecified pre-eclampsia, complicating childbirth: Secondary | ICD-10-CM | POA: Diagnosis present

## 2015-08-17 DIAGNOSIS — Z8249 Family history of ischemic heart disease and other diseases of the circulatory system: Secondary | ICD-10-CM | POA: Diagnosis not present

## 2015-08-17 LAB — CBC
HCT: 40.6 % (ref 36.0–46.0)
HEMOGLOBIN: 14.3 g/dL (ref 12.0–15.0)
MCH: 30.6 pg (ref 26.0–34.0)
MCHC: 35.2 g/dL (ref 30.0–36.0)
MCV: 86.8 fL (ref 78.0–100.0)
Platelets: 252 10*3/uL (ref 150–400)
RBC: 4.68 MIL/uL (ref 3.87–5.11)
RDW: 14.6 % (ref 11.5–15.5)
WBC: 16.7 10*3/uL — ABNORMAL HIGH (ref 4.0–10.5)

## 2015-08-17 SURGERY — Surgical Case
Anesthesia: Spinal | Site: Abdomen | Wound class: Clean Contaminated

## 2015-08-17 MED ORDER — WITCH HAZEL-GLYCERIN EX PADS: 1.0000 "application " | MEDICATED_PAD | CUTANEOUS | Status: DC | PRN

## 2015-08-17 MED ORDER — LACTATED RINGERS IV SOLN
INTRAVENOUS | Status: DC | PRN
  Administered 2015-08-17: 40 [IU] via INTRAVENOUS

## 2015-08-17 MED ORDER — OXYCODONE HCL 5 MG PO TABS: 10.0000 mg | ORAL_TABLET | ORAL | Status: DC | PRN

## 2015-08-17 MED ORDER — FENTANYL CITRATE (PF) 100 MCG/2ML IJ SOLN
INTRAMUSCULAR | Status: DC | PRN
  Administered 2015-08-17: 20 ug via INTRATHECAL

## 2015-08-17 MED ORDER — ONDANSETRON HCL 4 MG/2ML IJ SOLN
INTRAMUSCULAR | Status: AC
  Filled 2015-08-17: qty 2

## 2015-08-17 MED ORDER — NALBUPHINE HCL 10 MG/ML IJ SOLN: 5.0000 mg | Freq: Once | INTRAMUSCULAR | Status: DC | PRN

## 2015-08-17 MED ORDER — OXYCODONE HCL 5 MG PO TABS: 5.0000 mg | ORAL_TABLET | ORAL | Status: DC | PRN

## 2015-08-17 MED ORDER — BUPIVACAINE IN DEXTROSE 0.75-8.25 % IT SOLN
INTRATHECAL | Status: DC | PRN
  Administered 2015-08-17: 1.6 mL via INTRATHECAL

## 2015-08-17 MED ORDER — OXYCODONE-ACETAMINOPHEN 5-325 MG PO TABS: 1.0000 | ORAL_TABLET | ORAL | Status: DC | PRN

## 2015-08-17 MED ORDER — PHENYLEPHRINE 40 MCG/ML (10ML) SYRINGE FOR IV PUSH (FOR BLOOD PRESSURE SUPPORT)
PREFILLED_SYRINGE | INTRAVENOUS | Status: AC
  Filled 2015-08-17: qty 10

## 2015-08-17 MED ORDER — NALOXONE HCL 2 MG/2ML IJ SOSY: 1.0000 ug/kg/h | PREFILLED_SYRINGE | INTRAVENOUS | Status: DC | PRN

## 2015-08-17 MED ORDER — MORPHINE SULFATE (PF) 0.5 MG/ML IJ SOLN
INTRAMUSCULAR | Status: DC | PRN
  Administered 2015-08-17: .2 mg via INTRATHECAL

## 2015-08-17 MED ORDER — TETANUS-DIPHTH-ACELL PERTUSSIS 5-2.5-18.5 LF-MCG/0.5 IM SUSP: 0.5000 mL | Freq: Once | INTRAMUSCULAR | Status: DC

## 2015-08-17 MED ORDER — DEXAMETHASONE SODIUM PHOSPHATE 10 MG/ML IJ SOLN
10.0000 mg | Freq: Once | INTRAMUSCULAR | Status: AC
  Administered 2015-08-17: 10 mg via INTRAVENOUS
  Filled 2015-08-17: qty 1

## 2015-08-17 MED ORDER — KETOROLAC TROMETHAMINE 30 MG/ML IJ SOLN
30.0000 mg | Freq: Four times a day (QID) | INTRAMUSCULAR | Status: DC | PRN
  Filled 2015-08-17: qty 1

## 2015-08-17 MED ORDER — HYDROMORPHONE HCL 1 MG/ML IJ SOLN: 0.2500 mg | INTRAMUSCULAR | Status: DC | PRN

## 2015-08-17 MED ORDER — SODIUM CHLORIDE 0.9 % IR SOLN
Status: DC | PRN
  Administered 2015-08-17: 1

## 2015-08-17 MED ORDER — MENTHOL 3 MG MT LOZG
1.0000 | LOZENGE | OROMUCOSAL | Status: DC | PRN
  Filled 2015-08-17: qty 9

## 2015-08-17 MED ORDER — OXYTOCIN 40 UNITS IN LACTATED RINGERS INFUSION - SIMPLE MED: 2.5000 [IU]/h | INTRAVENOUS | Status: AC

## 2015-08-17 MED ORDER — PROMETHAZINE HCL 25 MG/ML IJ SOLN: 6.2500 mg | INTRAMUSCULAR | Status: DC | PRN

## 2015-08-17 MED ORDER — COCONUT OIL OIL: 1.0000 "application " | TOPICAL_OIL | Status: DC | PRN

## 2015-08-17 MED ORDER — FENTANYL CITRATE (PF) 100 MCG/2ML IJ SOLN
INTRAMUSCULAR | Status: AC
  Filled 2015-08-17: qty 2

## 2015-08-17 MED ORDER — SIMETHICONE 80 MG PO CHEW: 80.0000 mg | CHEWABLE_TABLET | ORAL | Status: DC

## 2015-08-17 MED ORDER — ONDANSETRON HCL 4 MG/2ML IJ SOLN: 4.0000 mg | Freq: Three times a day (TID) | INTRAMUSCULAR | Status: DC | PRN

## 2015-08-17 MED ORDER — ACETAMINOPHEN 500 MG PO TABS
1000.0000 mg | ORAL_TABLET | Freq: Four times a day (QID) | ORAL | Status: AC
  Administered 2015-08-17 – 2015-08-18 (×3): 1000 mg via ORAL
  Filled 2015-08-17 (×3): qty 2

## 2015-08-17 MED ORDER — KETOROLAC TROMETHAMINE 30 MG/ML IJ SOLN: 30.0000 mg | Freq: Once | INTRAMUSCULAR | Status: DC

## 2015-08-17 MED ORDER — MEPERIDINE HCL 25 MG/ML IJ SOLN: 6.2500 mg | INTRAMUSCULAR | Status: DC | PRN

## 2015-08-17 MED ORDER — SOD CITRATE-CITRIC ACID 500-334 MG/5ML PO SOLN
ORAL | Status: AC
  Filled 2015-08-17: qty 15

## 2015-08-17 MED ORDER — DIPHENHYDRAMINE HCL 25 MG PO CAPS: 25.0000 mg | ORAL_CAPSULE | Freq: Four times a day (QID) | ORAL | Status: DC | PRN

## 2015-08-17 MED ORDER — SIMETHICONE 80 MG PO CHEW
80.0000 mg | CHEWABLE_TABLET | Freq: Three times a day (TID) | ORAL | Status: DC
  Administered 2015-08-19: 80 mg via ORAL
  Filled 2015-08-17 (×2): qty 1

## 2015-08-17 MED ORDER — SIMETHICONE 80 MG PO CHEW
80.0000 mg | CHEWABLE_TABLET | ORAL | Status: DC | PRN
  Administered 2015-08-18: 80 mg via ORAL
  Filled 2015-08-17: qty 1

## 2015-08-17 MED ORDER — OXYTOCIN 10 UNIT/ML IJ SOLN
INTRAMUSCULAR | Status: AC
  Filled 2015-08-17: qty 4

## 2015-08-17 MED ORDER — LACTATED RINGERS IV SOLN
INTRAVENOUS | Status: DC | PRN
  Administered 2015-08-17: 18:00:00 via INTRAVENOUS

## 2015-08-17 MED ORDER — DIPHENHYDRAMINE HCL 50 MG/ML IJ SOLN: 12.5000 mg | INTRAMUSCULAR | Status: DC | PRN

## 2015-08-17 MED ORDER — SCOPOLAMINE 1 MG/3DAYS TD PT72: 1.0000 | MEDICATED_PATCH | Freq: Once | TRANSDERMAL | Status: DC

## 2015-08-17 MED ORDER — CEFAZOLIN SODIUM-DEXTROSE 2-3 GM-% IV SOLR
INTRAVENOUS | Status: DC | PRN
  Administered 2015-08-17: 2 g via INTRAVENOUS

## 2015-08-17 MED ORDER — SENNOSIDES-DOCUSATE SODIUM 8.6-50 MG PO TABS
2.0000 | ORAL_TABLET | ORAL | Status: DC
  Administered 2015-08-18 – 2015-08-19 (×2): 2 via ORAL
  Filled 2015-08-17 (×2): qty 2

## 2015-08-17 MED ORDER — DIPHENHYDRAMINE HCL 50 MG/ML IJ SOLN
25.0000 mg | Freq: Once | INTRAMUSCULAR | Status: AC
  Administered 2015-08-17: 25 mg via INTRAVENOUS
  Filled 2015-08-17: qty 1

## 2015-08-17 MED ORDER — MAGNESIUM SULFATE BOLUS VIA INFUSION
4.0000 g | Freq: Once | INTRAVENOUS | Status: AC
  Administered 2015-08-17: 4 g via INTRAVENOUS
  Filled 2015-08-17: qty 500

## 2015-08-17 MED ORDER — ACETAMINOPHEN 325 MG PO TABS: 650.0000 mg | ORAL_TABLET | ORAL | Status: DC | PRN

## 2015-08-17 MED ORDER — PHENYLEPHRINE 8 MG IN D5W 100 ML (0.08MG/ML) PREMIX OPTIME
INJECTION | INTRAVENOUS | Status: AC
  Filled 2015-08-17: qty 100

## 2015-08-17 MED ORDER — OXYCODONE-ACETAMINOPHEN 5-325 MG PO TABS: 2.0000 | ORAL_TABLET | ORAL | Status: DC | PRN

## 2015-08-17 MED ORDER — NALOXONE HCL 0.4 MG/ML IJ SOLN: 0.4000 mg | INTRAMUSCULAR | Status: DC | PRN

## 2015-08-17 MED ORDER — LACTATED RINGERS IV SOLN
INTRAVENOUS | Status: DC
  Administered 2015-08-18: via INTRAVENOUS

## 2015-08-17 MED ORDER — DIPHENHYDRAMINE HCL 25 MG PO CAPS: 25.0000 mg | ORAL_CAPSULE | ORAL | Status: DC | PRN

## 2015-08-17 MED ORDER — METOCLOPRAMIDE HCL 5 MG/ML IJ SOLN
10.0000 mg | Freq: Once | INTRAMUSCULAR | Status: AC
  Administered 2015-08-17: 10 mg via INTRAVENOUS
  Filled 2015-08-17: qty 2

## 2015-08-17 MED ORDER — NALBUPHINE HCL 10 MG/ML IJ SOLN: 5.0000 mg | INTRAMUSCULAR | Status: DC | PRN

## 2015-08-17 MED ORDER — KETOROLAC TROMETHAMINE 30 MG/ML IJ SOLN
INTRAMUSCULAR | Status: AC
  Administered 2015-08-17: 30 mg
  Filled 2015-08-17: qty 1

## 2015-08-17 MED ORDER — LACTATED RINGERS IV SOLN
INTRAVENOUS | Status: DC | PRN
  Administered 2015-08-17: 17:00:00 via INTRAVENOUS

## 2015-08-17 MED ORDER — ZOLPIDEM TARTRATE 5 MG PO TABS: 5.0000 mg | ORAL_TABLET | Freq: Every evening | ORAL | Status: DC | PRN

## 2015-08-17 MED ORDER — PHENYLEPHRINE 8 MG IN D5W 100 ML (0.08MG/ML) PREMIX OPTIME
INJECTION | INTRAVENOUS | Status: DC | PRN
  Administered 2015-08-17: 30 ug/min via INTRAVENOUS

## 2015-08-17 MED ORDER — IBUPROFEN 600 MG PO TABS: 600.0000 mg | ORAL_TABLET | Freq: Four times a day (QID) | ORAL | Status: DC | PRN

## 2015-08-17 MED ORDER — LACTATED RINGERS IV SOLN
2.0000 g/h | INTRAVENOUS | Status: AC
  Administered 2015-08-18: 2 g/h via INTRAVENOUS
  Filled 2015-08-17 (×2): qty 80

## 2015-08-17 MED ORDER — IBUPROFEN 600 MG PO TABS
600.0000 mg | ORAL_TABLET | Freq: Four times a day (QID) | ORAL | Status: DC
  Administered 2015-08-18 – 2015-08-19 (×3): 600 mg via ORAL
  Filled 2015-08-17 (×6): qty 1

## 2015-08-17 MED ORDER — DIBUCAINE 1 % RE OINT: 1.0000 "application " | TOPICAL_OINTMENT | RECTAL | Status: DC | PRN

## 2015-08-17 MED ORDER — PHENYLEPHRINE HCL 10 MG/ML IJ SOLN
INTRAMUSCULAR | Status: DC | PRN
  Administered 2015-08-17 (×4): 40 ug via INTRAVENOUS

## 2015-08-17 MED ORDER — PRENATAL MULTIVITAMIN CH
1.0000 | ORAL_TABLET | Freq: Every day | ORAL | Status: DC
  Filled 2015-08-17: qty 1

## 2015-08-17 MED ORDER — ONDANSETRON HCL 4 MG/2ML IJ SOLN
INTRAMUSCULAR | Status: DC | PRN
  Administered 2015-08-17: 4 mg via INTRAVENOUS

## 2015-08-17 MED ORDER — SODIUM CHLORIDE 0.9% FLUSH: 3.0000 mL | INTRAVENOUS | Status: DC | PRN

## 2015-08-17 MED ORDER — MORPHINE SULFATE-NACL 0.5-0.9 MG/ML-% IV SOSY
PREFILLED_SYRINGE | INTRAVENOUS | Status: AC
  Filled 2015-08-17: qty 1

## 2015-08-17 SURGICAL SUPPLY — 34 items
BENZOIN TINCTURE PRP APPL 2/3 (GAUZE/BANDAGES/DRESSINGS) ×2 IMPLANT
CATH ROBINSON RED A/P 16FR (CATHETERS) IMPLANT
CHLORAPREP W/TINT 26ML (MISCELLANEOUS) ×2 IMPLANT
CLAMP CORD UMBIL (MISCELLANEOUS) IMPLANT
CLIP FILSHIE TUBAL LIGA STRL (Clip) ×4 IMPLANT
CLOSURE STERI STRIP 1/2 X4 (GAUZE/BANDAGES/DRESSINGS) ×2 IMPLANT
CLOTH BEACON ORANGE TIMEOUT ST (SAFETY) ×2 IMPLANT
DRSG OPSITE POSTOP 4X10 (GAUZE/BANDAGES/DRESSINGS) ×2 IMPLANT
ELECT REM PT RETURN 9FT ADLT (ELECTROSURGICAL) ×2
ELECTRODE REM PT RTRN 9FT ADLT (ELECTROSURGICAL) ×1 IMPLANT
EXTRACTOR VACUUM M CUP 4 TUBE (SUCTIONS) IMPLANT
GLOVE BIOGEL PI IND STRL 7.0 (GLOVE) ×1 IMPLANT
GLOVE BIOGEL PI IND STRL 7.5 (GLOVE) ×2 IMPLANT
GLOVE BIOGEL PI INDICATOR 7.0 (GLOVE) ×1
GLOVE BIOGEL PI INDICATOR 7.5 (GLOVE) ×2
GLOVE ECLIPSE 7.5 STRL STRAW (GLOVE) ×2 IMPLANT
GOWN STRL REUS W/TWL LRG LVL3 (GOWN DISPOSABLE) ×6 IMPLANT
KIT ABG SYR 3ML LUER SLIP (SYRINGE) IMPLANT
NEEDLE HYPO 25X5/8 SAFETYGLIDE (NEEDLE) IMPLANT
NS IRRIG 1000ML POUR BTL (IV SOLUTION) ×2 IMPLANT
PACK C SECTION WH (CUSTOM PROCEDURE TRAY) ×2 IMPLANT
PAD OB MATERNITY 4.3X12.25 (PERSONAL CARE ITEMS) ×2 IMPLANT
PENCIL SMOKE EVAC W/HOLSTER (ELECTROSURGICAL) ×2 IMPLANT
RTRCTR C-SECT PINK 25CM LRG (MISCELLANEOUS) ×2 IMPLANT
STRIP CLOSURE SKIN 1/2X4 (GAUZE/BANDAGES/DRESSINGS) ×2 IMPLANT
SUT MNCRL 0 VIOLET CTX 36 (SUTURE) IMPLANT
SUT MONOCRYL 0 CTX 36 (SUTURE)
SUT VIC AB 0 CTX 36 (SUTURE) ×3
SUT VIC AB 0 CTX36XBRD ANBCTRL (SUTURE) ×3 IMPLANT
SUT VIC AB 2-0 CT1 27 (SUTURE) ×1
SUT VIC AB 2-0 CT1 TAPERPNT 27 (SUTURE) ×1 IMPLANT
SUT VIC AB 4-0 KS 27 (SUTURE) ×2 IMPLANT
TOWEL OR 17X24 6PK STRL BLUE (TOWEL DISPOSABLE) ×2 IMPLANT
TRAY FOLEY CATH SILVER 14FR (SET/KITS/TRAYS/PACK) ×2 IMPLANT

## 2015-08-17 NOTE — Op Note (Signed)
Cesarean Section Operative Report  Sandra Fuller  08/16/2015 - 08/17/2015  Indications: preeclampsia with severe features   Pre-operative Diagnosis: repeat cesarean section, severe pre-eclampsia, undesired fertility.   Post-operative Diagnosis: Same   Surgeon: Surgeon(s) and Role:    * Levie HeritageJacob J Stinson, DO - Primary    * Kathrynn RunningNoah Bedford Etosha Wetherell, MD - Assisting   Attending Attestation: I was present and scrubbed for the entire procedure.   Assistants: Genice Rougeracey Tucker  Anesthesia: spinal    Estimated Blood Loss: 500 ml  Total IV Fluids: 1000 ml LR  Urine Output:: 150 ml clear yellow urine  Specimens: placenta to patholoby  Findings: Viable infant in cephalic presentation; Apgars pending; weight pending g; arterial cord pH not obtained; clear amniotic fluid; intact placenta with three vessel cord; normal uterus, fallopian tubes and ovaries bilaterally. Significant adhesive disease to lower uterine segment encountered.  Baby condition / location:  Couplet care / Skin to Skin   Complications: no complications  Indications: Sandra Fuller is a 30 y.o. [phone] with an IUP 312w1d presenting with severe preeclampsia. For detailed discussion of indications see progress note.  The risks, benefits, complications, treatment options, and exected outcomes were discussed with the patient . The patient dwith the proposed plan, giving informed consent. identified as Sandra Fuller and the procedure verified as C-Section Delivery.  Procedure Details:  The patient was taken back to the operative suite where spinal anesthesia was placed.  A time out was held and the above information confirmed.   After induction of anesthesia, the patient was draped and prepped in the usual sterile manner and placed in a dorsal supine position with a leftward tilt. A Pfannenstiel incision was made and carried down through the subcutaneous tissue to the fascia. Fascial incision was made and sharply extended  transversely. The fascia was separated from the underlying rectus tissue superiorly and inferiorly. The peritoneum was identified and sharply entered and extended longitudinally. Lower uterine segment adhesions observed on lower uterine segment. Bladder blade placed placed. Bladder flap created. A low transverse uterine incision was made above the adhesions and extended bluntly. Delivered from cephalic presentation was a viable infant with Apgars and weight as above.  After waiting 60 seconds for delayed cord cutting, the umbilical cord was clamped and cut cord blood was obtained for evaluation. Cord ph was not sent. The placenta was removed Intact and appeared normal. The uterine outline, tubes and ovaries appeared normal. The uterine incision was closed with running locked sutures of 0Vicryl with an imbricating layer of the same.   Hemostasis was observed. The Fallopian tubes were identified bilaterally.  A Filshie clip was placed on each tube without difficulty 3 cm from the cornua.  There was no bleeding. The peritoneum was closed with 0 vicryl. The rectus muscles were examined and hemostasis observed. The fascia was then reapproximated with running sutures of 0Vicryl. The subcuticular closure was performed using 2-0 plain gut. The skin was closed with 4-0Vicryl.   Instrument, sponge, and needle counts were correct prior the abdominal closure and were correct at the conclusion of the case.     Disposition: PACU - hemodynamically stable.   Maternal Condition: stable       Signed: Lavonne Chickoah B WoukMD 08/17/2015 6:48 PM

## 2015-08-17 NOTE — Anesthesia Procedure Notes (Signed)
Spinal  Patient location during procedure: OR Start time: 08/17/2015 4:55 PM End time: 08/17/2015 4:59 PM Staffing Anesthesiologist: Leilani AbleHATCHETT, Gretel Cantu Performed: anesthesiologist  Preanesthetic Checklist Completed: patient identified, surgical consent, pre-op evaluation, timeout performed, IV checked, risks and benefits discussed and monitors and equipment checked Spinal Block Patient position: sitting Prep: site prepped and draped and DuraPrep Patient monitoring: heart rate, cardiac monitor, continuous pulse ox and blood pressure Approach: midline Location: L3-4 Injection technique: single-shot Needle Needle type: Sprotte  Needle gauge: 24 G Needle length: 9 cm Needle insertion depth: 7 cm Assessment Sensory level: T4

## 2015-08-17 NOTE — Anesthesia Preprocedure Evaluation (Signed)
Anesthesia Evaluation  Patient identified by MRN, date of birth, ID band Patient awake    Reviewed: Allergy & Precautions, H&P , Patient's Chart, lab work & pertinent test results  Airway Mallampati: III  TM Distance: >3 FB Neck ROM: full    Dental no notable dental hx.    Pulmonary neg pulmonary ROS,    Pulmonary exam normal breath sounds clear to auscultation       Cardiovascular hypertension, Pt. on home beta blockers negative cardio ROS Normal cardiovascular exam     Neuro/Psych negative psych ROS   GI/Hepatic negative GI ROS, Neg liver ROS,   Endo/Other  negative endocrine ROSMorbid obesity  Renal/GU negative Renal ROS     Musculoskeletal   Abdominal (+) + obese,   Peds  Hematology negative hematology ROS (+)   Anesthesia Other Findings   Reproductive/Obstetrics (+) Pregnancy                             Anesthesia Physical Anesthesia Plan  ASA: III  Anesthesia Plan: Spinal   Post-op Pain Management:    Induction:   Airway Management Planned:   Additional Equipment:   Intra-op Plan:   Post-operative Plan:   Informed Consent: I have reviewed the patients History and Physical, chart, labs and discussed the procedure including the risks, benefits and alternatives for the proposed anesthesia with the patient or authorized representative who has indicated his/her understanding and acceptance.     Plan Discussed with: CRNA and Surgeon  Anesthesia Plan Comments:         Anesthesia Quick Evaluation

## 2015-08-17 NOTE — H&P (Signed)
Faculty Practice H&P  Sandra Fuller is a 30 y.o. female [phone] with IUP at 6481w1d admitted for preeclampsia with severe headache.  Due to continuation of her headache, will proceed to cesarean section.    Pregnancy complications or risks: Patient Active Problem List   Diagnosis Date Noted  . Gestational hypertension 08/17/2015  . Mild pre-eclampsia 08/10/2015  . Gestational hypertension w/o significant proteinuria in 3rd trimester 08/04/2015  . 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 Obesity in pregnancy, antepartum 05/26/2015  . Two vessel umbilical cord, antepartum 05/02/2015  . Supervision of high risk pregnancy, antepartum 03/01/2015  . History of cesarean delivery, currently pregnant 03/01/2015  . Request for sterilization 01/27/2015  . Adult BMI 30+ 01/27/2015   She desires to bilateral tubal ligation.  She plans to plans to breastfeed  Prenatal labs and studies: ABO, Rh: --/--/O POS (08/14 1907) Antibody: NEG (08/14 1907) Rubella: !Error! RPR: NON REAC (06/23 1000)  HBsAg: Negative (01/24 0000)  HIV: NONREACTIVE (06/23 1000)  GBS:    1 hr Glucola 187 3Hr Gtt: 81, 157, 109, 155 Genetic screeningnormal Anatomy US 2vc  Past Medical History:  Past Medical History:  Diagnosis Date  . Headache    Migraines  . Hypertension     Past Surgical History:  Past Surgical History:  Procedure Laterality Date  . ARTHROSCOPIC REPAIR ACL Bilateral   . CESAREAN SECTION    . NOSE SURGERY     broken nose  . WISDOM TOOTH EXTRACTION      Obstetrical History:  OB History    Gravida Para Term Preterm AB Living   3 1 1   1 1    SAB TAB Ectopic Multiple Live Births   1       1       Social History:  Social History   Social History  . Marital status: Single    Spouse name: N/A  . Number of children: N/A  . Years of education: N/A   Occupational History  . homemeaker    Social History Main Topics  . Smoking status:  Never Smoker  . Smokeless tobacco: Never Used  . Alcohol use No     Comment: occassionally; not now  . Drug use: No  . Sexual activity: Yes    Partners: Male    Birth control/ protection: None   Other Topics Concern  . None   Social History Narrative  . None    Family History:  Family History  Problem Relation Age of Onset  . Hypertension Mother   . Diabetes Mother     gestational  . Preterm labor Mother   . Fibroids Mother   . Hypertension Father   . Heart disease Father   . Cancer - Lung Paternal Grandmother   . ADD / ADHD Daughter   . Asthma Daughter     Medications:  Prenatal vitamins,  Current Facility-Administered Medications  Medication Dose Route Frequency Provider Last Rate Last Dose  . [MAR Hold] butalbital-acetaminophen-caffeine (FIORICET, ESGIC) 50-325-40 MG per tablet 2 tablet  2 tablet Oral Q4H PRN Hurshel PartyLisa A Leftwich-Kirby, CNM   2 tablet at 08/16/15 1938  . [MAR Hold] calcium carbonate (TUMS - dosed in mg elemental calcium) chewable tablet 400 mg of elemental calcium  2 tablet Oral Q4H PRN Hurshel PartyLisa A Leftwich-Kirby, CNM      . [MAR Hold] docusate sodium (COLACE) capsule 100 mg  100 mg Oral Daily Hurshel PartyLisa A Leftwich-Kirby, CNM      . [  MAR Hold] ketorolac (TORADOL) 30 MG/ML injection 30 mg  30 mg Intravenous Q6H PRN Leilani AbleFranklin Hatchett, MD       Or  . Mitzi Hansen[MAR Hold] ketorolac (TORADOL) 30 MG/ML injection 30 mg  30 mg Intramuscular Q6H PRN Leilani AbleFranklin Hatchett, MD      . Mitzi Hansen[MAR Hold] labetalol (NORMODYNE) tablet 100 mg  100 mg Oral BID Hurshel PartyLisa A Leftwich-Kirby, CNM   100 mg at 08/16/15 2155  . [MAR Hold] prenatal multivitamin tablet 1 tablet  1 tablet Oral Q1200 Lisa A Leftwich-Kirby, CNM      . sodium chloride irrigation 0.9 %    PRN Levie HeritageJacob J Danisa Kopec, DO   1 application at 08/17/15 1651  . sodium citrate-citric acid (ORACIT) 500-334 MG/5ML solution           . [MAR Hold] zolpidem (AMBIEN) tablet 5 mg  5 mg Oral QHS PRN Hurshel PartyLisa A Leftwich-Kirby, CNM       Facility-Administered  Medications Ordered in Other Encounters  Medication Dose Route Frequency Provider Last Rate Last Dose  . ceFAZolin (ANCEF) IVPB 2 g/50 mL premix   Intravenous Anesthesia Intra-op Elgie CongoNataliya H Malinova, CRNA   2 g at 08/17/15 1657  . lactated ringers infusion    Continuous PRN Elgie CongoNataliya H Malinova, CRNA        Allergies:  Allergies  Allergen Reactions  . Adhesive [Tape] Rash    Review of Systems: - negative  Physical Exam: Blood pressure 135/63, pulse 98, temperature 98 F (36.7 C), temperature source Oral, resp. rate 18, height 5\' 6"  (1.676 m), weight 243 lb (110.2 kg), last menstrual period 12/14/2014. GENERAL: Well-developed, well-nourished female in no acute distress.  LUNGS: Clear to auscultation bilaterally.  HEART: Regular rate and rhythm. ABDOMEN: Soft, nontender, nondistended, gravid. EXTREMITIES: Nontender, no edema, 2+ distal pulses.  Pertinent Labs/Studies:    Assessment : Sandra Fuller is a 30 y.o. G3P1011 at 4237w1d being admitted for cesarean section secondary to preeclampsia with severe features.  Plan: The risks of cesarean section discussed with the patient included but were not limited to: bleeding which may require transfusion or reoperation; infection which may require antibiotics; injury to bowel, bladder, ureters or other surrounding organs; injury to the fetus; need for additional procedures including hysterectomy in the event of a life-threatening hemorrhage; placental abnormalities wth subsequent pregnancies, incisional problems, thromboembolic phenomenon and other postoperative/anesthesia complications. The patient concurred with the proposed plan, giving informed written consent for the procedure.   Patient has been NPO since last night and will remain NPO for procedure.  Preoperative prophylactic Ancef ordered on call to the OR.    Levie HeritageJacob J Kaelyn Nauta, DO 08/17/2015, 5:01 PM

## 2015-08-17 NOTE — Transfer of Care (Signed)
Immediate Anesthesia Transfer of Care Note  Patient: Genella Rifeshley D Koehne  Procedure(s) Performed: Procedure(s): CESAREAN SECTION (N/A)  Patient Location: PACU  Anesthesia Type:Spinal  Level of Consciousness: awake, alert  and oriented  Airway & Oxygen Therapy: Patient Spontanous Breathing  Post-op Assessment: Report given to RN and Post -op Vital signs reviewed and stable  Post vital signs: Reviewed and stable  Last Vitals:  Vitals:   08/17/15 1120 08/17/15 1336  BP: (!) 144/79 135/63  Pulse: 89 98  Resp: 18 18  Temp:      Last Pain:  Vitals:   08/17/15 1248  TempSrc:   PainSc: 8       Patients Stated Pain Goal: 0 (08/16/15 1654)  Complications: No apparent anesthesia complications

## 2015-08-17 NOTE — Progress Notes (Signed)
Patient ID: Sandra Fuller, female   DOB: 11-04-1985, 30 y.o.   MRN: [id number]  FACULTY PRACTICE ANTEPARTUM NOTE  Sandra Fuller is a 30 y.o. G3P1011 at 5035w1d  who is admitted for severe preeclampsia.   Fetal presentation is cephalic. Length of Stay:  1  Days  Subjective: Patient reports continued severe headache. Patient reports good fetal movement.   She reports no uterine contractions She reports no bleeding  She reports no loss of fluid per vagina.  Vitals:  Blood pressure 135/63, pulse 98, temperature 98 F (36.7 C), temperature source Oral, resp. rate 18, height 5\' 6"  (1.676 m), weight 243 lb (110.2 kg), last menstrual period 12/14/2014. Physical Examination:  General appearance - alert, well appearing, and in no distress Mental status - alert, oriented to person, place, and time Lymphatics - no palpable lymphadenopathy, no hepatosplenomegaly Chest - clear to auscultation, no wheezes, rales or rhonchi, symmetric air entry Abdomen - soft, nontender, nondistended, no masses or organomegaly Fundal Height:  size equals dates Membranes:intact  Fetal Monitoring:  Baseline: 140 bpm, Variability: Good {> 6 bpm), Accelerations: Reactive and Decelerations: Absent  Labs:  Results for orders placed or performed during the hospital encounter of 08/16/15 (from the past 24 hour(s))  Protein / creatinine ratio, urine   Collection Time: 08/16/15  4:30 PM  Result Value Ref Range   Creatinine, Urine 115.00 mg/dL   Total Protein, Urine 31 mg/dL   Protein Creatinine Ratio 0.27 (H) 0.00 - 0.15 mg/mg[Cre]  Urinalysis, Routine w reflex microscopic ([facility])   Collection Time: 08/16/15  4:30 PM  Result Value Ref Range   Color, Urine YELLOW YELLOW   APPearance CLEAR CLEAR   Specific Gravity, Urine 1.020 1.005 - 1.030   pH 7.0 5.0 - 8.0   Glucose, UA NEGATIVE NEGATIVE mg/dL   Hgb urine dipstick NEGATIVE NEGATIVE   Bilirubin Urine NEGATIVE NEGATIVE   Ketones, ur 15 (A) NEGATIVE mg/dL    Protein, ur NEGATIVE NEGATIVE mg/dL   Nitrite NEGATIVE NEGATIVE   Leukocytes, UA NEGATIVE NEGATIVE  CBC   Collection Time: 08/16/15  5:29 PM  Result Value Ref Range   WBC 15.9 (H) 4.0 - 10.5 K/uL   RBC 4.43 3.87 - 5.11 MIL/uL   Hemoglobin 13.1 12.0 - 15.0 g/dL   HCT 08.638.9 57.836.0 - 46.946.0 %   MCV 87.8 78.0 - 100.0 fL   MCH 29.6 26.0 - 34.0 pg   MCHC 33.7 30.0 - 36.0 g/dL   RDW 62.914.5 52.811.5 - 41.315.5 %   Platelets 230 150 - 400 K/uL  Comprehensive metabolic panel   Collection Time: 08/16/15  5:29 PM  Result Value Ref Range   Sodium 138 135 - 145 mmol/L   Potassium 4.3 3.5 - 5.1 mmol/L   Chloride 107 101 - 111 mmol/L   CO2 23 22 - 32 mmol/L   Glucose, Bld 89 65 - 99 mg/dL   BUN 6 6 - 20 mg/dL   Creatinine, Ser 2.440.39 (L) 0.44 - 1.00 mg/dL   Calcium 9.0 8.9 - 01.010.3 mg/dL   Total Protein 6.1 (L) 6.5 - 8.1 g/dL   Albumin 2.8 (L) 3.5 - 5.0 g/dL   AST 15 15 - 41 U/L   ALT 10 (L) 14 - 54 U/L   Alkaline Phosphatase 99 38 - 126 U/L   Total Bilirubin 0.6 0.3 - 1.2 mg/dL   GFR calc non Af Amer >60 >60 mL/min   GFR calc Af Amer >60 >60 mL/min   Anion  gap 8 5 - 15  Type and screen [facility]   Collection Time: 08/16/15  7:07 PM  Result Value Ref Range   ABO/RH(D) O POS    Antibody Screen NEG    Sample Expiration 08/19/2015     Imaging Studies:       Medications:  Scheduled . docusate sodium  100 mg Oral Daily  . labetalol  100 mg Oral BID  . prenatal multivitamin  1 tablet Oral Q1200  . sodium citrate-citric acid       I have reviewed the patient's current medications.  ASSESSMENT: Patient Active Problem List   Diagnosis Date Noted  . Gestational hypertension 08/17/2015  . Mild pre-eclampsia 08/10/2015  . Gestational hypertension w/o significant proteinuria in 3rd trimester 08/04/2015  . 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 Obesity in pregnancy, antepartum 05/26/2015  . Two vessel umbilical cord, antepartum  05/02/2015  . Supervision of high risk pregnancy, antepartum 03/01/2015  . History of cesarean delivery, currently pregnant 03/01/2015  . Request for sterilization 01/27/2015  . Adult BMI 30+ 01/27/2015    PLAN: I discussed with the patient that with a severe headache, unrelieved by medication, that this would qualify for preeclampsia with severe features.  I discussed the risks of preterm delivery with the patient including difficulty breathing, need for intubation and other procedures.  Patient stated that her headache is severe.  I also discussed this patient with Dr Sherrie Georgeecker of MFM, who agrees with delivery.    The risks of cesarean section discussed with the patient included but were not limited to: bleeding which may require transfusion or reoperation; infection which may require antibiotics; injury to bowel, bladder, ureters or other surrounding organs; injury to the fetus; need for additional procedures including hysterectomy in the event of a life-threatening hemorrhage; placental abnormalities wth subsequent pregnancies, incisional problems, thromboembolic phenomenon and other postoperative/anesthesia complications. The patient concurred with the proposed plan, giving informed written consent for the procedure.   Patient has been NPO since last night she will remain NPO for procedure. Anesthesia and OR aware.  Preoperative prophylactic Ancef ordered on call to the OR.  To OR when ready.  Levie HeritageJacob J Stinson, DO 08/17/2015 4:24 PM   Continue routine antenatal care.   Levie HeritageJacob J Stinson, DO 08/17/2015,4:21 PM

## 2015-08-17 NOTE — Anesthesia Postprocedure Evaluation (Signed)
Anesthesia Post Note  Patient: Sandra Fuller  Procedure(s) Performed: Procedure(s) (LRB): CESAREAN SECTION (N/A)  Patient location during evaluation: PACU Anesthesia Type: Spinal Level of consciousness: awake Pain management: pain level controlled Vital Signs Assessment: post-procedure vital signs reviewed and stable Respiratory status: spontaneous breathing Cardiovascular status: stable Postop Assessment: no backache, spinal receding, no headache, patient able to bend at knees and no signs of nausea or vomiting Anesthetic complications: no     Last Vitals:  Vitals:   08/17/15 2044 08/17/15 2049  BP:    Pulse: (!) 127 (!) 125  Resp:    Temp:      Last Pain:  Vitals:   08/17/15 2030  TempSrc:   PainSc: 0-No pain   Pain Goal: Patients Stated Pain Goal: 0 (08/16/15 1654)               Avory Rahimi JR,JOHN Susann GivensFRANKLIN

## 2015-08-18 ENCOUNTER — Encounter (HOSPITAL_COMMUNITY): Payer: Self-pay | Admitting: Anesthesiology

## 2015-08-18 ENCOUNTER — Encounter (HOSPITAL_COMMUNITY): Payer: Self-pay | Admitting: Family Medicine

## 2015-08-18 LAB — CBC
HEMATOCRIT: 34.7 % — AB (ref 36.0–46.0)
HEMOGLOBIN: 11.9 g/dL — AB (ref 12.0–15.0)
MCH: 29.8 pg (ref 26.0–34.0)
MCHC: 34.3 g/dL (ref 30.0–36.0)
MCV: 87 fL (ref 78.0–100.0)
Platelets: 242 10*3/uL (ref 150–400)
RBC: 3.99 MIL/uL (ref 3.87–5.11)
RDW: 14.7 % (ref 11.5–15.5)
WBC: 19.5 10*3/uL — ABNORMAL HIGH (ref 4.0–10.5)

## 2015-08-18 MED ORDER — LACTATED RINGERS IV SOLN
125.0000 mL/h | INTRAVENOUS | Status: DC
  Administered 2015-08-18: 125 mL/h via INTRAVENOUS

## 2015-08-18 MED ORDER — OXYTOCIN 40 UNITS IN LACTATED RINGERS INFUSION - SIMPLE MED: 2.5000 [IU]/h | INTRAVENOUS | Status: AC

## 2015-08-18 MED ORDER — PRENATAL MULTIVITAMIN CH
1.0000 | ORAL_TABLET | Freq: Every day | ORAL | Status: DC
  Administered 2015-08-18: 1 via ORAL

## 2015-08-18 MED ORDER — SIMETHICONE 80 MG PO CHEW: 80.0000 mg | CHEWABLE_TABLET | ORAL | Status: DC | PRN

## 2015-08-18 MED ORDER — ACETAMINOPHEN 325 MG PO TABS: 650.0000 mg | ORAL_TABLET | ORAL | Status: DC | PRN

## 2015-08-18 MED ORDER — DIPHENHYDRAMINE HCL 25 MG PO CAPS: 25.0000 mg | ORAL_CAPSULE | Freq: Four times a day (QID) | ORAL | Status: DC | PRN

## 2015-08-18 MED ORDER — LACTATED RINGERS IV SOLN: INTRAVENOUS | Status: DC

## 2015-08-18 MED ORDER — IBUPROFEN 600 MG PO TABS
600.0000 mg | ORAL_TABLET | Freq: Four times a day (QID) | ORAL | Status: DC
  Administered 2015-08-18 – 2015-08-19 (×3): 600 mg via ORAL

## 2015-08-18 MED ORDER — TETANUS-DIPHTH-ACELL PERTUSSIS 5-2.5-18.5 LF-MCG/0.5 IM SUSP
0.5000 mL | Freq: Once | INTRAMUSCULAR | Status: DC
  Filled 2015-08-18: qty 0.5

## 2015-08-18 MED ORDER — SIMETHICONE 80 MG PO CHEW
80.0000 mg | CHEWABLE_TABLET | Freq: Three times a day (TID) | ORAL | Status: DC
Start: 2015-08-18 — End: 2015-08-19
  Administered 2015-08-18 (×3): 80 mg via ORAL
  Filled 2015-08-18: qty 1

## 2015-08-18 MED ORDER — CEFAZOLIN SODIUM-DEXTROSE 2-4 GM/100ML-% IV SOLN
2.0000 g | INTRAVENOUS | Status: AC
  Filled 2015-08-18: qty 100

## 2015-08-18 MED ORDER — ZOLPIDEM TARTRATE 5 MG PO TABS: 5.0000 mg | ORAL_TABLET | Freq: Every evening | ORAL | Status: DC | PRN

## 2015-08-18 MED ORDER — SENNOSIDES-DOCUSATE SODIUM 8.6-50 MG PO TABS: 2.0000 | ORAL_TABLET | ORAL | Status: DC

## 2015-08-18 MED ORDER — SIMETHICONE 80 MG PO CHEW
80.0000 mg | CHEWABLE_TABLET | ORAL | Status: DC
  Filled 2015-08-18: qty 1

## 2015-08-18 MED ORDER — COCONUT OIL OIL: 1.0000 "application " | TOPICAL_OIL | Status: DC | PRN

## 2015-08-18 MED ORDER — MENTHOL 3 MG MT LOZG
1.0000 | LOZENGE | OROMUCOSAL | Status: DC | PRN
  Filled 2015-08-18: qty 9

## 2015-08-18 NOTE — Lactation Note (Signed)
This note was copied from a baby's chart. Lactation Consultation Note  Patient Name: Sandra Fuller [id number]'s Date: 08/18/2015 Reason for consult: Initial assessment;Late preterm infant;Infant < 6lbs  LPI 17 hours old. Mom holding baby STS after baby's bath. Mom reports that she attempted to nurse first child, but baby never latched well. Mom states that she pumped with first child, but had little to no EBM. Mom given LPI guidelines with review. Set mom up with DEBP and reviewed cleaning and use. Mom wants to eat after holding baby and then start pumping. Mom states that she attempted to latch the baby earlier, but baby sleeping now. Enc mom to call out for assistance with pumping/latching as needed. Mom states that [facility] and has already called and cannot get in until Tuesday, 08-24-15. [facility] and is aware of $30 deposit.   Enc mom to put baby to breast with cues and at least every 3 hours. Enc mom to supplement baby with EBM/formula according to supplementation guidelines, which were given with review. Enc mom to post-pump after each feeding followed by hand expression. Discussed EBM storage guidelines, and enc mom to offer lots of STS. Mom aware of keeping the total feeding time to 30 minutes, and to avoid overstimulation of baby. Maternal Data Has patient been taught Hand Expression?: Yes (Per mom.)  Feeding Feeding Type: Formula Nipple Type: Slow - flow  LATCH Score/Interventions                      Lactation Tools Discussed/Used WIC Program: Yes [facility]on Lan([place] Pump Review: Setup, frequency, and cleaning;Milk Storage Initiated by:: JW Date initiated:: 08/18/15   Consult Status Consult Status: Follow-up Date: 08/19/15 Follow-up type: In-patient    Sherlyn HayJennifer D Garrus Gauthreaux 08/18/2015, 10:35 AM

## 2015-08-18 NOTE — Anesthesia Postprocedure Evaluation (Signed)
Anesthesia Post Note  Patient: Sandra Fuller  Procedure(s) Performed: Procedure(s) (LRB): CESAREAN SECTION (N/A)  Patient location during evaluation: Mother Baby Anesthesia Type: Spinal Level of consciousness: awake and alert and oriented Pain management: satisfactory to patient Vital Signs Assessment: post-procedure vital signs reviewed and stable Respiratory status: spontaneous breathing and nonlabored ventilation Cardiovascular status: stable Postop Assessment: no headache, no backache, patient able to bend at knees, no signs of nausea or vomiting and adequate PO intake Anesthetic complications: no     Last Vitals:  Vitals:   08/18/15 0700 08/18/15 0743  BP:  140/84  Pulse:  (!) 104  Resp: 20   Temp:  37.1 C    Last Pain:  Vitals:   08/18/15 0745  TempSrc:   PainSc: 2    Pain Goal: Patients Stated Pain Goal: 0 (08/16/15 1654)               Madison HickmanGREGORY,[name]

## 2015-08-18 NOTE — Progress Notes (Signed)
Magnesium was interrupted due to infiltration prior to 0700 today. Two unsuccessful attempts were made to place a new IV site. Anesthesia was contacted and CRNA successfully placed an IV for pt. Magnesium was resumed at 0815.

## 2015-08-18 NOTE — Progress Notes (Signed)
Subjective: Postpartum Day 1: Cesarean Delivery Patient reports mild incisional pain, tolerating PO and no problems voiding.    Objective: Vital signs in last 24 hours: Temp:  [97.7 F (36.5 C)-98.9 F (37.2 C)] 98.9 F (37.2 C) (08/16 0400) Pulse Rate:  [89-137] 115 (08/16 0445) Resp:  [14-24] 18 (08/16 0600) BP: (117-151)/(54-91) 145/61 (08/16 0445) SpO2:  [93 %-100 %] 98 % (08/16 0439)  Physical Exam:  General: alert, cooperative and no distress Heart: regular rate, no murmur Lungs: clear to auscultation bilaterally, no wheezing.  Lochia: appropriate Uterine Fundus: firm DVT Evaluation: No evidence of DVT seen on physical exam. Negative Homan's sign. No cords or calf tenderness.   Recent Labs  08/17/15 1625 08/18/15 0521  HGB 14.3 11.9*  HCT 40.6 34.7*    Assessment/Plan: Status post Cesarean section. Doing well postoperatively.  Continue current care. Magx24 hours Will watch BP following.  Edee Nifong JEHIEL 08/18/2015, 6:58 AM

## 2015-08-18 NOTE — Addendum Note (Signed)
Addendum  created 08/18/15 0757 by Angeline Trick M Shravan Salahuddin, CRNA   Sign clinical note    

## 2015-08-19 ENCOUNTER — Ambulatory Visit: Payer: Self-pay

## 2015-08-19 MED ORDER — OXYCODONE-ACETAMINOPHEN 5-325 MG PO TABS: 1.0000 | ORAL_TABLET | ORAL | 0 refills | Status: DC | PRN

## 2015-08-19 MED ORDER — IBUPROFEN 600 MG PO TABS: 600.0000 mg | ORAL_TABLET | Freq: Four times a day (QID) | ORAL | 0 refills | Status: DC

## 2015-08-19 NOTE — Discharge Summary (Signed)
OB Discharge Summary     Patient Name: Sandra Fuller DOB: [birth year] MRN: [id number]  Date of admission: 08/16/2015 Delivering MD: Shonna ChockWOUK, NOAH BEDFORD   Date of discharge: 08/19/2015  Admitting diagnosis: 35w mild pre elampsia, really bad headache Intrauterine pregnancy: [id number]     Secondary diagnosis:  Active Problems:   Gestational hypertension   Status post cesarean delivery  Additional problems:      Discharge diagnosis: severe pre eclampsia                                                                                                Post partum procedures:none  Augmentation: na  Complications: None  Hospital course:  Repeat Caesarean section with bilateral tubal ligation  Physical exam  Vitals:   08/18/15 1628 08/18/15 1919 08/19/15 0006 08/19/15 0421  BP: (!) 146/78 (!) 141/79 (!) 146/76 (!) 142/76  Pulse: (!) 113 (!) 103 97 (!) 101  Resp:  18 18 18   Temp:  97.9 F (36.6 C) 97.6 F (36.4 C) 98 F (36.7 C)  TempSrc:  Oral Oral Oral  SpO2:      Weight:      Height:       General: alert, cooperative and no distress Lochia: appropriate Uterine Fundus: firm Incision: Healing well with no significant drainage DVT Evaluation: No evidence of DVT seen on physical exam. Labs: Lab Results  Component Value Date   WBC 19.5 (H) 08/18/2015   HGB 11.9 (L) 08/18/2015   HCT 34.7 (L) 08/18/2015   MCV 87.0 08/18/2015   PLT 242 08/18/2015   CMP Latest Ref Rng & Units 08/16/2015  Glucose 65 - 99 mg/dL 89  BUN 6 - 20 mg/dL 6  Creatinine 4.090.44 - 8.111.00 mg/dL 9.14(N0.39(L)  Sodium 829135 - 562145 mmol/L 138  Potassium 3.5 - 5.1 mmol/L 4.3  Chloride 101 - 111 mmol/L 107  CO2 22 - 32 mmol/L 23  Calcium 8.9 - 10.3 mg/dL 9.0  Total Protein 6.5 - 8.1 g/dL 6.1(L)  Total Bilirubin 0.3 - 1.2 mg/dL 0.6  Alkaline Phos 38 - 126 U/L 99  AST 15 - 41 U/L 15  ALT 14 - 54 U/L 10(L)    Discharge instruction: per After Visit Summary and "Baby and Me Booklet".  After visit meds:     Medication List    TAKE these medications   acetaminophen 325 MG tablet Commonly known as:  TYLENOL Take 650 mg by mouth every 6 (six) hours as needed for headache.   docusate sodium 100 MG capsule Commonly known as:  COLACE Take 1 capsule (100 mg total) by mouth daily.   ibuprofen 600 MG tablet Commonly known as:  ADVIL,MOTRIN Take 1 tablet (600 mg total) by mouth every 6 (six) hours.   labetalol 100 MG tablet Commonly known as:  NORMODYNE Take 1 tablet (100 mg total) by mouth 2 (two) times daily.   oxyCODONE-acetaminophen 5-325 MG tablet Commonly known as:  PERCOCET/ROXICET Take 1 tablet by mouth every 4 (four) hours as needed (pain scale 4-7).   prenatal multivitamin Tabs tablet Take 1 tablet by mouth at bedtime.  Diet: routine diet  Activity: Advance as tolerated. Pelvic rest for 6 weeks.   Outpatient follow up:1 week Follow up Appt:No future appointments. Follow up Visit:No Follow-up on file.  Postpartum contraception: Tubal Ligation  Newborn Data: Live born female  Birth Weight: 5 lb 6.1 oz (2440 g) APGAR: 8, 9  Baby Feeding: Breast Disposition:   08/19/2015 Lazaro ArmsEURE,Chantelle Verdi H, MD

## 2015-08-19 NOTE — Discharge Instructions (Signed)
Cesarean Delivery, Care After  Refer to this sheet in the next few weeks. These instructions provide you with information on caring for yourself after your procedure. Your health care provider may also give you specific instructions. Your treatment has been planned according to current medical practices, but problems sometimes occur. Call your health care provider if you have any problems or questions after you go home.  HOME CARE INSTRUCTIONS   Only take over-the-counter or prescription medications as directed by your health care provider.   Do not drink alcohol, especially if you are breastfeeding or taking medication to relieve pain.   Do not chew or smoke tobacco.   Continue to use good perineal care. Good perineal care includes:    Wiping your perineum from front to back.    Keeping your perineum clean.   Check your surgical cut (incision) daily for increased redness, drainage, swelling, or separation of skin.   Clean your incision gently with soap and water every day, and then pat it dry. If your health care provider says it is okay, leave the incision uncovered. Use a bandage (dressing) if the incision is draining fluid or appears irritated. If the adhesive strips across the incision do not fall off within 7 days, carefully peel them off.   Hug a pillow when coughing or sneezing until your incision is healed. This helps to relieve pain.   Do not use tampons or douche until your health care provider says it is okay.   Shower, wash your hair, and take tub baths as directed by your health care provider.   Wear a well-fitting bra that provides breast support.   Limit wearing support panties or control-top hose.   Drink enough fluids to keep your urine clear or pale yellow.   Eat high-fiber foods such as whole grain cereals and breads, brown rice, beans, and fresh fruits and vegetables every day. These foods may help prevent or relieve constipation.   Resume activities such as climbing stairs,  driving, lifting, exercising, or traveling as directed by your health care provider.   Talk to your health care provider about resuming sexual activities. This is dependent upon your risk of infection, your rate of healing, and your comfort and desire to resume sexual activity.   Try to have someone help you with your household activities and your newborn for at least a few days after you leave the hospital.   Rest as much as possible. Try to rest or take a nap when your newborn is sleeping.   Increase your activities gradually.   Keep all of your scheduled postpartum appointments. It is very important to keep your scheduled follow-up appointments. At these appointments, your health care provider will be checking to make sure that you are healing physically and emotionally.  SEEK MEDICAL CARE IF:    You are passing large clots from your vagina. Save any clots to show your health care provider.   You have a foul smelling discharge from your vagina.   You have trouble urinating.   You are urinating frequently.   You have pain when you urinate.   You have a change in your bowel movements.   You have increasing redness, pain, or swelling near your incision.   You have pus draining from your incision.   Your incision is separating.   You have painful, hard, or reddened breasts.   You have a severe headache.   You have blurred vision or see spots.   You feel sad   or depressed.   You have thoughts of hurting yourself or your newborn.   You have questions about your care, the care of your newborn, or medications.   You are dizzy or light-headed.   You have a rash.   You have pain, redness, or swelling at the site of the removed intravenous access (IV) tube.   You have nausea or vomiting.   You stopped breastfeeding and have not had a menstrual period within 12 weeks of stopping.   You are not breastfeeding and have not had a menstrual period within 12 weeks of delivery.   You have a fever.  SEEK  IMMEDIATE MEDICAL CARE IF:   You have persistent pain.   You have chest pain.   You have shortness of breath.   You faint.   You have leg pain.   You have stomach pain.   Your vaginal bleeding saturates 2 or more sanitary pads in 1 hour.  MAKE SURE YOU:    Understand these instructions.   Will watch your condition.   Will get help right away if you are not doing well or get worse.     This information is not intended to replace advice given to you by your health care provider. Make sure you discuss any questions you have with your health care provider.     Document Released: 09/10/2001 Document Revised: 01/09/2014 Document Reviewed: 08/16/2011  Elsevier Interactive Patient Education 2016 [employer]

## 2015-08-19 NOTE — Lactation Note (Addendum)
This note was copied from a baby's chart. Lactation Consultation Note  Patient Name: Boy Gari Crownshley Yinger GEXBM'WToday's Date: 08/19/2015 Reason for consult: Follow-up assessment  With this mom and LPI, now 4046 hours old and 35 3/7 weeks CGa, weight last evening at 6% loss, down to 5 lbs 1 oz. When I walked in the room, mom had fed Victory Dakiniley, and he had taken about 13 ml's. Mom allowed me to take him from her, and try and feed him. He burped, and then I showed mom side lying, and he fed more. Mom then finished the feeding, and he took a toal of 28 ml's I told mom she did well, and that she needs to burp him, side lie, and allow him to take closer to the amount suggested in the LPI green sheet . I also reviewed hand expression with mom, and showed her that she does have colostrum. A drop was finger fed to the baby. I reiviewed with mom the importacne of pumping followed by hand expression, at least every 3 hours. I also reviewed engorgement care, and explained that mom should feel her breast getting fuller and warmed from tonight into tomorrow, and to ask for ice packs if needed. Mom does have a Medela DEP at home.     Maternal Data    Feeding Feeding Type: Bottle Fed - Formula Nipple Type: Slow - flow  LATCH Score/Interventions                      Lactation Tools Discussed/Used     Consult Status Consult Status: Follow-up Date: 08/20/15 Follow-up type: In-patient    Alfred LevinsLee, Nichol Ator Anne 08/19/2015, 3:44 PM

## 2015-08-20 ENCOUNTER — Ambulatory Visit: Payer: Self-pay

## 2015-08-20 NOTE — Lactation Note (Signed)
This note was copied from a baby's chart. Lactation Consultation Note  Patient Name: Boy Gari Crownshley Witz ZOXWR'UToday's Date: 08/20/2015 Reason for consult: Follow-up assessment   With this mom of a LPI, doing much better with feeding amounts, and gained weight ( 7.4 % loss down to 6 % loss). Mom pumped and expressed 35 ml's this morning. She knows to feed EBM prior to formula, and to pump and hand express every 3 hours. Mom denies questions/concerns at this time, and knows to call for lactation if she wants to begin latching baby when he gets bigger, for o/p consult or questions/consers.    Maternal Data    Feeding Feeding Type: Bottle Fed - Formula Nipple Type: Slow - flow  LATCH Score/Interventions                      Lactation Tools Discussed/Used     Consult Status Consult Status: Complete Follow-up type: Call as needed    Alfred LevinsLee, Baker Kogler Anne 08/20/2015, 9:03 AM

## 2015-08-23 ENCOUNTER — Encounter: Payer: Self-pay | Admitting: Advanced Practice Midwife

## 2015-08-23 ENCOUNTER — Ambulatory Visit (INDEPENDENT_AMBULATORY_CARE_PROVIDER_SITE_OTHER): Payer: Medicaid Other | Admitting: Advanced Practice Midwife

## 2015-08-23 VITALS — BP 135/90 | HR 100 | Ht 66.0 in | Wt 229.0 lb

## 2015-08-23 DIAGNOSIS — Z98891 History of uterine scar from previous surgery: Secondary | ICD-10-CM

## 2015-08-23 DIAGNOSIS — O1493 Unspecified pre-eclampsia, third trimester: Secondary | ICD-10-CM

## 2015-08-24 NOTE — Patient Instructions (Signed)

## 2015-08-24 NOTE — Progress Notes (Signed)
Subjective:     Sandra Fuller is a 30 y.o. female who presents for a postpartum visit. She is 4 days postpartum following a low cervical transverse Cesarean section. I have fully reviewed the prenatal and intrapartum course. The delivery was at 35 gestational weeks. Outcome: repeat cesarean section, low transverse incision. Anesthesia: epidural. Postpartum course has been remarkable for gestational hypertension. Baby's course has been uneventful. Baby is feeding by breast. Bleeding thin lochia. Bowel function is normal. Bladder function is normal. Patient is not sexually active. Contraception method is tubal ligation. Postpartum depression screening: negative.  The following portions of the patient's history were reviewed and updated as appropriate: allergies, current medications, past family history, past medical history, past social history, past surgical history and problem list.  Review of Systems Pertinent items are noted in HPI.   Objective:    BP 135/90 (BP Location: Left Arm, Patient Position: Sitting, Cuff Size: Large)   Pulse 100   Ht 5\' 6"  (1.676 m)   Wt 103.9 kg (229 lb)   LMP 12/14/2014   Breastfeeding? Yes   BMI 36.96 kg/m   General:  alert and cooperative   Breasts:  inspection negative, no nipple discharge or bleeding, no masses or nodularity palpable  Lungs: clear to auscultation bilaterally  Heart:  regular rate and rhythm, S1, S2 normal, no murmur, click, rub or gallop  Abdomen: soft, non-tender; bowel sounds normal; no masses,  no organomegaly and Incision healing well, honeycomb dressing removed, steristrips intact   Vulva:  not evaluated  Vagina: not evaluated  Cervix:  n/a  Corpus: not examined  Adnexa:  not evaluated  Rectal Exam: Not performed.        Assessment:     Normal 1st week  postpartum exam.   Plan:    1. May take steristrips off when they start peeling 2.   Continue Labetalol. Will reevaluate at 6 week visit 2. Follow up in: 5 weeks or as  needed.

## 2015-09-09 ENCOUNTER — Encounter: Payer: Self-pay | Admitting: Advanced Practice Midwife

## 2015-09-09 ENCOUNTER — Other Ambulatory Visit (HOSPITAL_COMMUNITY): Payer: Self-pay | Admitting: Obstetrics & Gynecology

## 2015-09-10 ENCOUNTER — Other Ambulatory Visit (HOSPITAL_COMMUNITY): Payer: Self-pay | Admitting: Obstetrics & Gynecology

## 2015-09-13 ENCOUNTER — Encounter (HOSPITAL_COMMUNITY): Admission: RE | Payer: Self-pay | Source: Ambulatory Visit

## 2015-09-13 ENCOUNTER — Telehealth: Payer: Self-pay | Admitting: *Deleted

## 2015-09-13 ENCOUNTER — Inpatient Hospital Stay (HOSPITAL_COMMUNITY): Admission: RE | Admit: 2015-09-13 | Payer: Medicaid Other | Source: Ambulatory Visit | Admitting: Family Medicine

## 2015-09-13 SURGERY — Surgical Case
Anesthesia: Regional | Laterality: Bilateral

## 2015-09-13 NOTE — Telephone Encounter (Signed)
Pt called in wanting Rx refill of Percocet due to continued pain from C/S. Spoke with Dorathy KinsmanVirginia Smith, CNM and advised pt that if Ibuprofen q6h wasn't helping that she would need to come in for eval in the event something has happened to surgical site. Transferred to [employer] to make appt.

## 2015-09-14 ENCOUNTER — Encounter: Payer: Self-pay | Admitting: Obstetrics and Gynecology

## 2015-09-14 ENCOUNTER — Ambulatory Visit (INDEPENDENT_AMBULATORY_CARE_PROVIDER_SITE_OTHER): Payer: Medicaid Other | Admitting: Obstetrics and Gynecology

## 2015-09-14 NOTE — Progress Notes (Signed)
Subjective:     Sandra Fuller is a 30 y.o. female who presents for a postpartum visit. She is 6 weeks postpartum following a low cervical transverse Cesarean section. I have fully reviewed the prenatal and intrapartum course. The delivery was at 35 gestational weeks. Outcome: repeat cesarean section, low transverse incision. Anesthesia: spinal. Postpartum course has been complicated by gestational hypertension. Baby's course has been uncomplicated. Baby is feeding by bottle - Similac Neosure. Bleeding no bleeding. Bowel function is normal. Bladder function is normal. Patient is not sexually active. Contraception method is tubal ligation. Postpartum depression screening: negative.     Review of Systems Pertinent items are noted in HPI.   Objective:    BP 120/79   Pulse 99   Ht 5\' 5"  (1.651 m)   Wt 225 lb (102.1 kg)   Breastfeeding? No   BMI 37.44 kg/m   General:  alert, cooperative and no distress   Breasts:  inspection negative, no nipple discharge or bleeding, no masses or nodularity palpable  Lungs: clear to auscultation bilaterally  Heart:  regular rate and rhythm  Abdomen: soft, non-tender; bowel sounds normal; no masses,  no organomegaly. Incision healed   Vulva:  normal  Vagina: normal vagina, no discharge, exudate, lesion, or erythema  Cervix:    Corpus: normal size, contour, position, consistency, mobility, non-tender  Adnexa:  normal adnexa and no mass, fullness, tenderness  Rectal Exam: Not performed.        Assessment:     Normal postpartum exam. Pap smear not done at today's visit.   Plan:    1. Contraception: tubal ligation 2. Patient is medically cleared to resume all activities of daily living Patient may discontinue labetalol and check her BP at home.  3. Follow up in: as needed.

## 2015-09-27 ENCOUNTER — Ambulatory Visit: Payer: Medicaid Other | Admitting: Certified Nurse Midwife

## 2015-09-28 ENCOUNTER — Encounter: Payer: Self-pay | Admitting: *Deleted

## 2015-10-24 ENCOUNTER — Emergency Department (INDEPENDENT_AMBULATORY_CARE_PROVIDER_SITE_OTHER)
Admission: EM | Admit: 2015-10-24 | Discharge: 2015-10-24 | Disposition: A | Discharge status: Home / Self Care | Payer: Medicaid Other | Source: Home / Self Care | Attending: Emergency Medicine | Admitting: Emergency Medicine

## 2015-10-24 ENCOUNTER — Emergency Department (INDEPENDENT_AMBULATORY_CARE_PROVIDER_SITE_OTHER): Payer: Medicaid Other

## 2015-10-24 DIAGNOSIS — M25441 Effusion, right hand: Secondary | ICD-10-CM

## 2015-10-24 DIAGNOSIS — M25431 Effusion, right wrist: Secondary | ICD-10-CM

## 2015-10-24 DIAGNOSIS — S60221A Contusion of right hand, initial encounter: Secondary | ICD-10-CM | POA: Diagnosis not present

## 2015-10-24 NOTE — Discharge Instructions (Signed)
Return if any problems.

## 2015-10-24 NOTE — ED Triage Notes (Signed)
Patient skating and fell, landed on right hand/wrist , swelling and pain

## 2015-10-25 NOTE — ED Provider Notes (Signed)
[facility]-EMERGENCY DEPT Provider Note   CSN: [id number] Arrival date & time: 10/24/15  1723     History   Chief Complaint Chief Complaint  Patient presents with  . Hand Injury    Right    HPI Sandra Fuller is a 30 y.o. female.  The history is provided by the patient. No language interpreter was used.  Hand Injury   The incident occurred 3 to 5 hours ago. Incident location: skate place. The injury mechanism was a fall. The pain is present in the right hand and right wrist. The quality of the pain is described as aching. The pain is moderate. The pain has been constant since the incident. She reports no foreign bodies present. The symptoms are aggravated by movement. She has tried nothing for the symptoms. The treatment provided no relief.  Pt reports falling while skating.  Pt reports swelling on the top of her hand.  Past Medical History:  Diagnosis Date  . Headache    Migraines  . Hypertension     Patient Active Problem List   Diagnosis Date Noted  . Gestational hypertension 08/17/2015  . Status post cesarean delivery 08/17/2015  . Mild pre-eclampsia 08/10/2015  . 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 History of cesarean delivery, currently pregnant 03/01/2015  . Request for sterilization 01/27/2015  . Adult BMI 30+ 01/27/2015    Past Surgical History:  Procedure Laterality Date  . ARTHROSCOPIC REPAIR ACL Bilateral   . CESAREAN SECTION    . CESAREAN SECTION N/A 08/17/2015   Procedure: CESAREAN SECTION;  Surgeon: Levie Heritage, DO;  Location: [facility] BIRTHING SUITES;  Service: Obstetrics;  Laterality: N/A;  . NOSE SURGERY     broken nose  . WISDOM TOOTH EXTRACTION      OB History    Gravida Para Term Preterm AB Living   3 2 1 1 1 2    SAB TAB Ectopic Multiple Live Births   1       1       Home Medications    Prior to Admission medications   Medication Sig Start Date End Date Taking? Authorizing Provider    acetaminophen (TYLENOL) 325 MG tablet Take 650 mg by mouth every 6 (six) hours as needed for headache.   Yes Historical Provider, MD  docusate sodium (COLACE) 100 MG capsule Take 1 capsule (100 mg total) by mouth daily. 08/12/15  Yes Tilda Burrow, MD  ibuprofen (ADVIL,MOTRIN) 600 MG tablet Take 1 tablet (600 mg total) by mouth every 6 (six) hours. 08/19/15  Yes Lazaro Arms, MD  labetalol (NORMODYNE) 100 MG tablet Take 1 tablet (100 mg total) by mouth 2 (two) times daily. 08/12/15  Yes Tilda Burrow, MD  oxyCODONE-acetaminophen (PERCOCET/ROXICET) 5-325 MG tablet Take 1 tablet by mouth every 4 (four) hours as needed (pain scale 4-7). 08/19/15  Yes Lazaro Arms, MD  Prenatal Vit-Fe Fumarate-FA (PRENATAL MULTIVITAMIN) TABS tablet Take 1 tablet by mouth at bedtime.   Yes Historical Provider, MD    Family History Family History  Problem Relation Age of Onset  . Hypertension Mother   . Diabetes Mother     gestational  . Preterm labor Mother   . Fibroids Mother   . Hypertension Father   . Heart disease Father   . Cancer - Lung Paternal Grandmother   . ADD / ADHD Daughter   . Asthma Daughter     Social History Social History  Substance Use Topics  .  Smoking status: Never Smoker  . Smokeless tobacco: Never Used  . Alcohol use No     Comment: occassionally; not now     Allergies   Adhesive [tape]   Review of Systems Review of Systems  All other systems reviewed and are negative.    Physical Exam Updated Vital Signs BP 118/72 (BP Location: Left Arm)   Pulse 119   Temp 98.1 F (36.7 C) (Oral)   Ht 5\' 6"  (1.676 m)   Wt 102.5 kg   SpO2 100%   BMI 36.48 kg/m   Physical Exam  Constitutional: She is oriented to person, place, and time. She appears well-developed and well-nourished.  HENT:  Head: Normocephalic.  Eyes: EOM are normal.  Neck: Normal range of motion.  Pulmonary/Chest: Effort normal.  Abdominal: She exhibits no distension.  Musculoskeletal: Normal range  of motion.  Swollen dorsal hand,  Tender radius and ulna at wrist,  Pain with range of motion,  nv and ns intact  Neurological: She is alert and oriented to person, place, and time.  Psychiatric: She has a normal mood and affect.  Nursing note and vitals reviewed.    ED Treatments / Results  Labs (all labs ordered are listed, but only abnormal results are displayed) Labs Reviewed - No data to display  EKG  EKG Interpretation None       Radiology Dg Wrist Complete Right  Result Date: 10/24/2015 CLINICAL DATA:  Skating injury, pain and swelling along the hand and thumb, tenderness along the right wrist. EXAM: RIGHT HAND - COMPLETE 3+ VIEW; RIGHT WRIST - COMPLETE 3+ VIEW COMPARISON:  None. FINDINGS: Right wrist, three views: There is some mild indistinct effacement of the pronator fat pad but I do not see an underlying fracture. Scapholunate angle normal. No osseous findings of gamekeeper's thumb. There is some dorsal soft tissue swelling over the metacarpals. Right hand, three views: Soft tissue swelling along the dorsum of the hand along the metacarpals. I do not observe a fracture. No obvious malalignment. IMPRESSION: 1. Right wrist: No fracture or acute bony findings identified. Dorsal soft tissue swelling overlying the metacarpals. Mild soft tissue swelling along the radial wrist. 2. Right hand: Dorsal soft tissue swelling over the metacarpals without visible fracture. Electronically Signed   By: Gaylyn Rong M.D.   On: 10/24/2015 18:15   Dg Hand Complete Right  Result Date: 10/24/2015 CLINICAL DATA:  Skating injury, pain and swelling along the hand and thumb, tenderness along the right wrist. EXAM: RIGHT HAND - COMPLETE 3+ VIEW; RIGHT WRIST - COMPLETE 3+ VIEW COMPARISON:  None. FINDINGS: Right wrist, three views: There is some mild indistinct effacement of the pronator fat pad but I do not see an underlying fracture. Scapholunate angle normal. No osseous findings of gamekeeper's  thumb. There is some dorsal soft tissue swelling over the metacarpals. Right hand, three views: Soft tissue swelling along the dorsum of the hand along the metacarpals. I do not observe a fracture. No obvious malalignment. IMPRESSION: 1. Right wrist: No fracture or acute bony findings identified. Dorsal soft tissue swelling overlying the metacarpals. Mild soft tissue swelling along the radial wrist. 2. Right hand: Dorsal soft tissue swelling over the metacarpals without visible fracture. Electronically Signed   By: Gaylyn Rong M.D.   On: 10/24/2015 18:15    Procedures Procedures (including critical care time)  Medications Ordered in ED Medications - No data to display   Initial Impression / Assessment and Plan / ED Course  I have reviewed  the triage vital signs and the nursing notes.  Pertinent labs & imaging results that were available during my care of the patient were reviewed by me and considered in my medical decision making (see chart for details).  Clinical Course  Value Comment By Time  DG Hand Complete Right (Reviewed) Elson AreasLeslie K Earland Reish, PA-C 10/22 1822  DG Wrist Complete Right (Reviewed) Elson AreasLeslie K Prather Failla, PA-C 10/22 1823    Pr placed in an ace wrap.  Pt advised recheck in 1 week if pain persist.  Ice to area of pain and swelling  Final Clinical Impressions(s) / ED Diagnoses   Final diagnoses:  Contusion of right hand, initial encounter    New Prescriptions Discharge Medication List as of 10/24/2015  6:26 PM     Current Meds  Medication Sig  . acetaminophen (TYLENOL) 325 MG tablet Take 650 mg by mouth every 6 (six) hours as needed for headache.  . docusate sodium (COLACE) 100 MG capsule Take 1 capsule (100 mg total) by mouth daily.  Marland Kitchen. ibuprofen (ADVIL,MOTRIN) 600 MG tablet Take 1 tablet (600 mg total) by mouth every 6 (six) hours.  Marland Kitchen. labetalol (NORMODYNE) 100 MG tablet Take 1 tablet (100 mg total) by mouth 2 (two) times daily.  Marland Kitchen. oxyCODONE-acetaminophen  (PERCOCET/ROXICET) 5-325 MG tablet Take 1 tablet by mouth every 4 (four) hours as needed (pain scale 4-7).  . Prenatal Vit-Fe Fumarate-FA (PRENATAL MULTIVITAMIN) TABS tablet Take 1 tablet by mouth at bedtime.  An After Visit Summary was printed and given to the patient.   Lonia SkinnerLeslie K ChrismanSofia, PA-C 10/25/15 2221

## 2015-10-29 ENCOUNTER — Ambulatory Visit: Payer: Medicaid Other | Admitting: Advanced Practice Midwife

## 2015-11-02 ENCOUNTER — Encounter: Payer: Self-pay | Admitting: Advanced Practice Midwife

## 2015-12-13 ENCOUNTER — Encounter: Payer: Self-pay | Admitting: Obstetrics & Gynecology

## 2015-12-13 ENCOUNTER — Ambulatory Visit (INDEPENDENT_AMBULATORY_CARE_PROVIDER_SITE_OTHER): Payer: Medicaid Other | Admitting: Obstetrics & Gynecology

## 2015-12-13 VITALS — BP 127/87 | HR 89 | Resp 16 | Ht 65.0 in | Wt 237.0 lb

## 2015-12-13 DIAGNOSIS — N921 Excessive and frequent menstruation with irregular cycle: Secondary | ICD-10-CM

## 2015-12-13 DIAGNOSIS — Z0000 Encounter for general adult medical examination without abnormal findings: Secondary | ICD-10-CM

## 2015-12-13 MED ORDER — FLUOXETINE HCL 20 MG PO CAPS: ORAL_CAPSULE | ORAL | 6 refills | Status: DC

## 2015-12-13 MED ORDER — NORGESTREL-ETHINYL ESTRADIOL 0.3-30 MG-MCG PO TABS: 1.0000 | ORAL_TABLET | Freq: Every day | ORAL | 11 refills | Status: DC

## 2015-12-13 NOTE — Progress Notes (Signed)
   Subjective:    Patient ID: Sandra Fuller, female    DOB: 11-Apr-1985, 30 y.o.   MRN: [id number]  HPI 30 yo SW P2 (30 yo and 404 month old kids) here today due to having 6 periods in the last 4 months, since delivery. She had a BTL and LTCS. Prior to getting her BTL her periods were light and regular with OCPs. She reports new onset of "crying at the drop of a hat", also feeling some anxiety and snapping at people. Of note, her son was born at 2735 weeks EGA. She denies SI and HI.   Review of Systems  Her 78 yo daughter has become very sassy, showing signs of puberty, jealous of the baby.  The baby has reflux and preemie issues.     Objective:   Physical Exam [id number] Breathing, conversing, and ambulating normally       Assessment & Plan:  Menorrhagia/DUB- check TSH, CBC, vit D Restart lo ovral Anxiety/depression- probably due to the stress of a preemie. Prozac 20 mg q AM She will be seen by Richmond CampbellJaimie next week RTC 3-4 weeks

## 2015-12-14 ENCOUNTER — Telehealth: Payer: Self-pay | Admitting: *Deleted

## 2015-12-14 DIAGNOSIS — R7989 Other specified abnormal findings of blood chemistry: Secondary | ICD-10-CM

## 2015-12-14 LAB — CBC
HEMATOCRIT: 40.3 % (ref 35.0–45.0)
HEMOGLOBIN: 13.5 g/dL (ref 11.7–15.5)
MCH: 28.8 pg (ref 27.0–33.0)
MCHC: 33.5 g/dL (ref 32.0–36.0)
MCV: 86.1 fL (ref 80.0–100.0)
MPV: 10.7 fL (ref 7.5–12.5)
Platelets: 328 10*3/uL (ref 140–400)
RBC: 4.68 MIL/uL (ref 3.80–5.10)
RDW: 13.6 % (ref 11.0–15.0)
WBC: 9.3 10*3/uL (ref 3.8–10.8)

## 2015-12-14 LAB — TSH: TSH: 1.83 m[IU]/L

## 2015-12-14 LAB — VITAMIN D 25 HYDROXY (VIT D DEFICIENCY, FRACTURES): VIT D 25 HYDROXY: 18 ng/mL — AB (ref 30–100)

## 2015-12-14 MED ORDER — VITAMIN D (ERGOCALCIFEROL) 1.25 MG (50000 UNIT) PO CAPS: 50000.0000 [IU] | ORAL_CAPSULE | ORAL | 0 refills | Status: DC

## 2015-12-14 NOTE — Telephone Encounter (Signed)
LM on home phone of low Vitamin D levels and Rx for Vitamin D 50K was sent to her pharmacy.  She will take once weekly for 8 weeks then RTC for repeat serum level

## 2015-12-20 ENCOUNTER — Institutional Professional Consult (permissible substitution): Payer: Medicaid Other

## 2016-01-06 ENCOUNTER — Ambulatory Visit: Payer: Medicaid Other | Admitting: Obstetrics & Gynecology

## 2016-06-14 ENCOUNTER — Encounter: Payer: Self-pay | Admitting: Obstetrics & Gynecology

## 2016-06-14 ENCOUNTER — Ambulatory Visit (INDEPENDENT_AMBULATORY_CARE_PROVIDER_SITE_OTHER): Payer: Medicaid Other | Admitting: Obstetrics & Gynecology

## 2016-06-14 VITALS — BP 124/82 | HR 84 | Resp 16 | Ht 65.0 in | Wt 229.0 lb

## 2016-06-14 DIAGNOSIS — N920 Excessive and frequent menstruation with regular cycle: Secondary | ICD-10-CM

## 2016-06-14 DIAGNOSIS — R102 Pelvic and perineal pain: Secondary | ICD-10-CM | POA: Diagnosis not present

## 2016-06-14 LAB — CBC
HEMATOCRIT: 42.4 % (ref 35.0–45.0)
Hemoglobin: 14 g/dL (ref 11.7–15.5)
MCH: 28.8 pg (ref 27.0–33.0)
MCHC: 33 g/dL (ref 32.0–36.0)
MCV: 87.2 fL (ref 80.0–100.0)
MPV: 10.7 fL (ref 7.5–12.5)
Platelets: 343 10*3/uL (ref 140–400)
RBC: 4.86 MIL/uL (ref 3.80–5.10)
RDW: 14 % (ref 11.0–15.0)
WBC: 11.1 10*3/uL — ABNORMAL HIGH (ref 3.8–10.8)

## 2016-06-14 MED ORDER — BUTALBITAL-APAP-CAFFEINE 50-325-40 MG PO TABS: 1.0000 | ORAL_TABLET | Freq: Four times a day (QID) | ORAL | 3 refills | Status: DC | PRN

## 2016-06-14 NOTE — Progress Notes (Signed)
   Subjective:    Patient ID: Sandra Fuller, female    DOB: April 07, 1985, 31 y.o.   MRN: [id number]  HPI 31 yo MW P2 here for heavy painful periods, and generally CPP. I saw her 12/17 for DUB, 4 months after her RLTCS and BTL. I restarted her OCPs to manage her periods. Generally, her periods have been lighter since then, but not the last period.   I had also started her on prozac for depression. She did not follow up. She took it for about 3 months, felt better, and stopped taking the meds. She reports that she is doing well now.   Review of Systems     Objective:   Physical Exam Pleasant obese WFNAD Breathing, conversing, and ambulating normally Bimanual exam reveals a ULN size uterus, mobile, NT No palpable adnexal masses but exam is limited by her body habitus       Assessment & Plan:  CPP, menorrhagia- check CBC and gyn u/s

## 2016-06-29 ENCOUNTER — Ambulatory Visit (INDEPENDENT_AMBULATORY_CARE_PROVIDER_SITE_OTHER): Payer: Medicaid Other

## 2016-06-29 DIAGNOSIS — N920 Excessive and frequent menstruation with regular cycle: Secondary | ICD-10-CM

## 2016-07-03 ENCOUNTER — Ambulatory Visit (INDEPENDENT_AMBULATORY_CARE_PROVIDER_SITE_OTHER): Payer: Medicaid Other | Admitting: Obstetrics & Gynecology

## 2016-07-03 ENCOUNTER — Encounter: Payer: Self-pay | Admitting: Obstetrics & Gynecology

## 2016-07-03 VITALS — BP 125/82 | HR 97 | Resp 16 | Ht 65.0 in | Wt 229.0 lb

## 2016-07-03 DIAGNOSIS — R102 Pelvic and perineal pain: Secondary | ICD-10-CM

## 2016-07-03 DIAGNOSIS — N920 Excessive and frequent menstruation with regular cycle: Secondary | ICD-10-CM | POA: Diagnosis not present

## 2016-07-03 MED ORDER — IBUPROFEN 800 MG PO TABS: 800.0000 mg | ORAL_TABLET | Freq: Three times a day (TID) | ORAL | 1 refills | Status: DC | PRN

## 2016-07-03 MED ORDER — LEVONORGEST-ETH ESTRAD 91-DAY 0.15-0.03 &0.01 MG PO TABS: 1.0000 | ORAL_TABLET | Freq: Every day | ORAL | 4 refills | Status: DC

## 2016-07-03 NOTE — Progress Notes (Signed)
   Subjective:    Patient ID: Sandra Fuller, female    DOB: [birth year], 31 y.o.   MRN: [id number]  HPI 31 yo W P2 (9 and 32 yo kids) here for follow up of her labs/us. These were done for irregular periods. She was started on OCPs and has not had a period since her last visit. She reports that when she has periods they are very painful, much less so without a period. She is almost bedridden with pain on the first 2 days of her period.   Review of Systems Had a BTL in the past with her second C/S    Objective:   Physical Exam  WNWHWFNAD Breathing, conversing, and ambulating normally Abd- benign      Assessment & Plan:  Dysmenorrhea- change OCPs to camrese (extended cycle) Add IBU prn RTC 4 months

## 2016-07-31 ENCOUNTER — Encounter: Payer: Self-pay | Admitting: Obstetrics & Gynecology

## 2016-09-17 ENCOUNTER — Other Ambulatory Visit: Payer: Self-pay | Admitting: Obstetrics & Gynecology

## 2016-12-11 ENCOUNTER — Ambulatory Visit: Payer: Self-pay | Admitting: Obstetrics & Gynecology

## 2016-12-14 ENCOUNTER — Telehealth: Payer: Self-pay

## 2016-12-14 DIAGNOSIS — Z3041 Encounter for surveillance of contraceptive pills: Secondary | ICD-10-CM

## 2016-12-14 MED ORDER — LEVONORGEST-ETH ESTRAD 91-DAY 0.15-0.03 &0.01 MG PO TABS: 1.0000 | ORAL_TABLET | Freq: Every day | ORAL | 0 refills | Status: DC

## 2016-12-14 NOTE — Telephone Encounter (Signed)
Pt called stating that she runs out of her birth control this week & needs a refill. Pt has appt with us in a week so I told her I would give her one refill.

## 2016-12-21 ENCOUNTER — Ambulatory Visit: Payer: Self-pay | Admitting: Obstetrics & Gynecology

## 2017-01-09 ENCOUNTER — Ambulatory Visit: Payer: Self-pay | Admitting: Obstetrics & Gynecology

## 2017-01-29 ENCOUNTER — Ambulatory Visit: Payer: Medicaid Other

## 2017-02-05 ENCOUNTER — Encounter: Payer: Self-pay | Admitting: Obstetrics & Gynecology

## 2017-02-05 ENCOUNTER — Ambulatory Visit (INDEPENDENT_AMBULATORY_CARE_PROVIDER_SITE_OTHER): Payer: Medicaid Other | Admitting: Obstetrics & Gynecology

## 2017-02-05 ENCOUNTER — Ambulatory Visit: Payer: Medicaid Other | Admitting: Physical Therapy

## 2017-02-05 ENCOUNTER — Other Ambulatory Visit (HOSPITAL_COMMUNITY)
Admission: RE | Admit: 2017-02-05 | Discharge: 2017-02-05 | Disposition: A | Discharge status: Home / Self Care | Payer: Medicaid Other | Source: Ambulatory Visit | Attending: Obstetrics & Gynecology | Admitting: Obstetrics & Gynecology

## 2017-02-05 VITALS — BP 137/92 | HR 91 | Ht 67.0 in | Wt 221.0 lb

## 2017-02-05 DIAGNOSIS — Z01419 Encounter for gynecological examination (general) (routine) without abnormal findings: Secondary | ICD-10-CM | POA: Diagnosis present

## 2017-02-05 DIAGNOSIS — Z3041 Encounter for surveillance of contraceptive pills: Secondary | ICD-10-CM

## 2017-02-05 DIAGNOSIS — Z0000 Encounter for general adult medical examination without abnormal findings: Secondary | ICD-10-CM

## 2017-02-05 MED ORDER — LEVONORGEST-ETH ESTRAD 91-DAY 0.15-0.03 &0.01 MG PO TABS: 1.0000 | ORAL_TABLET | Freq: Every day | ORAL | 5 refills | Status: DC

## 2017-02-05 NOTE — Progress Notes (Signed)
Subjective:    Sandra Fuller is a 32 y.o. single P2 (9 years and 5818 month old kids)  female who presents for an annual exam. The patient has no complaints today. The patient is sexually active. GYN screening history: last pap: was normal. The patient wears seatbelts: yes. The patient participates in regular exercise: yes. Has the patient ever been transfused or tattooed?: yes. The patient reports that there is not domestic violence in her life.   Menstrual History: OB History    Gravida Para Term Preterm AB Living   3 2 1 1 1 2    SAB TAB Ectopic Multiple Live Births   1       1      Menarche age: [age] Patient's last menstrual period was 12/26/2016.    The following portions of the patient's history were reviewed and updated as appropriate: allergies, current medications, past family history, past medical history, past social history, past surgical history and problem list.  Review of Systems Pertinent items are noted in HPI.   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, criminal justice FH- no breast/gyn/colon cancer Her primary care does her lab work    Objective:    BP (!) 137/92   Pulse 91   Ht 5\' 7"  (1.702 m)   Wt 221 lb (100.2 kg)   LMP 12/26/2016   BMI 34.61 kg/m   General Appearance:    Alert, cooperative, no distress, appears stated age  Head:    Normocephalic, without obvious abnormality, atraumatic  Eyes:    PERRL, conjunctiva/corneas clear, EOM's intact, fundi    benign, both eyes  Ears:    Normal TM's and external ear canals, both ears  Nose:   Nares normal, septum midline, mucosa normal, no drainage    or sinus tenderness  Throat:   Lips, mucosa, and tongue normal; teeth and gums normal  Neck:   Supple, symmetrical, trachea midline, no adenopathy;    thyroid:  no enlargement/tenderness/nodules; no carotid   bruit or JVD  Back:     Symmetric, no curvature, ROM normal, no CVA tenderness  Lungs:     Clear to auscultation bilaterally,  respirations unlabored  Chest Wall:    No tenderness or deformity   Heart:    Regular rate and rhythm, S1 and S2 normal, no murmur, rub   or gallop  Breast Exam:    No tenderness, masses, or nipple abnormality  Abdomen:     Soft, non-tender, bowel sounds active all four quadrants,    no masses, no organomegaly  Genitalia:    Normal female without lesion, discharge or tenderness, normal size and shape, anteverted, mobile, non-tender, normal adnexal exam      Extremities:   Extremities normal, atraumatic, no cyanosis or edema  Pulses:   2+ and symmetric all extremities  Skin:   Skin color, texture, turgor normal, no rashes or lesions  Lymph nodes:   Cervical, supraclavicular, and axillary nodes normal  Neurologic:   CNII-XII intact, normal strength, sensation and reflexes    throughout  .    Assessment:    Healthy female exam.    Plan:     Thin prep Pap smear. with cotesting She may have refills of her OCPs for 3 years without an exam.

## 2017-02-06 LAB — LIPID PANEL
Cholesterol: 169 mg/dL (ref <200)
HDL: 36 mg/dL — ABNORMAL LOW (ref >50)
LDL Cholesterol (Calc): 109 mg/dL (calc) — ABNORMAL HIGH
NON-HDL CHOLESTEROL (CALC): 133 mg/dL — AB (ref <130)
Total CHOL/HDL Ratio: 4.7 (calc) (ref <5.0)
Triglycerides: 125 mg/dL (ref <150)

## 2017-02-08 LAB — CYTOLOGY - PAP
DIAGNOSIS: NEGATIVE
HPV: NOT DETECTED

## 2017-04-12 ENCOUNTER — Other Ambulatory Visit: Payer: Self-pay | Admitting: Obstetrics & Gynecology

## 2017-04-12 ENCOUNTER — Telehealth: Payer: Self-pay | Admitting: Obstetrics & Gynecology

## 2017-04-12 ENCOUNTER — Encounter (INDEPENDENT_AMBULATORY_CARE_PROVIDER_SITE_OTHER): Payer: Self-pay

## 2017-04-12 MED ORDER — IBUPROFEN 800 MG PO TABS: 800.0000 mg | ORAL_TABLET | Freq: Three times a day (TID) | ORAL | 1 refills | Status: DC | PRN

## 2017-04-12 NOTE — Telephone Encounter (Signed)
Pt request refill ibuprofen and Firocet, no refills remain. [facility]. Pt ph [phone].

## 2017-04-12 NOTE — Progress Notes (Signed)
IBU called in I sent her a message recommending that for a fiorcet refill that she go to the doctor that originally wrote that for her.

## 2017-05-23 ENCOUNTER — Encounter: Payer: Self-pay | Admitting: Obstetrics & Gynecology

## 2017-05-31 IMAGING — US US MFM OB FOLLOW-UP
1 series · 14 of 28 positions shown · non-contrast
Comparison: none

[Series 1: us mfm ob follow-up · 14 of 44 slices shown]
[im 2/44]
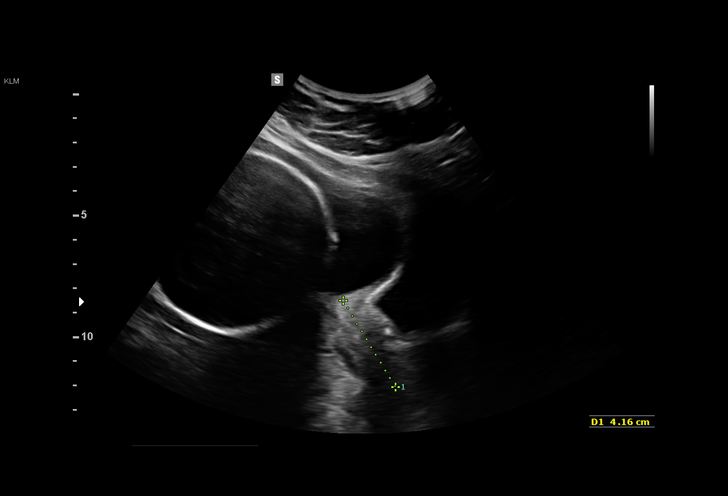
[im 5/44]
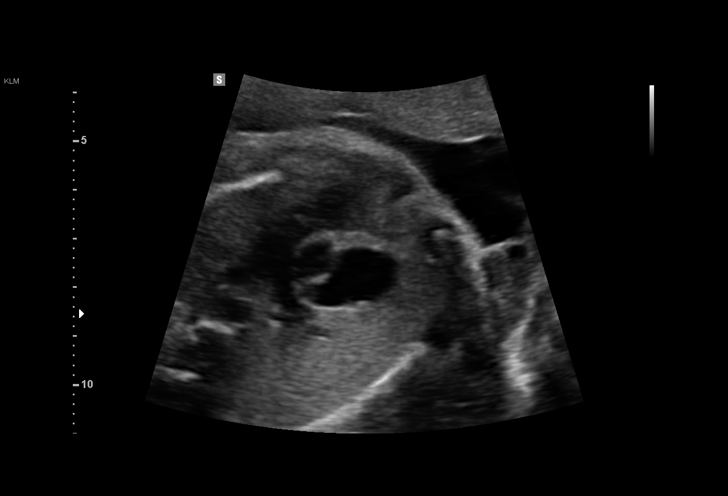
[im 8/44]
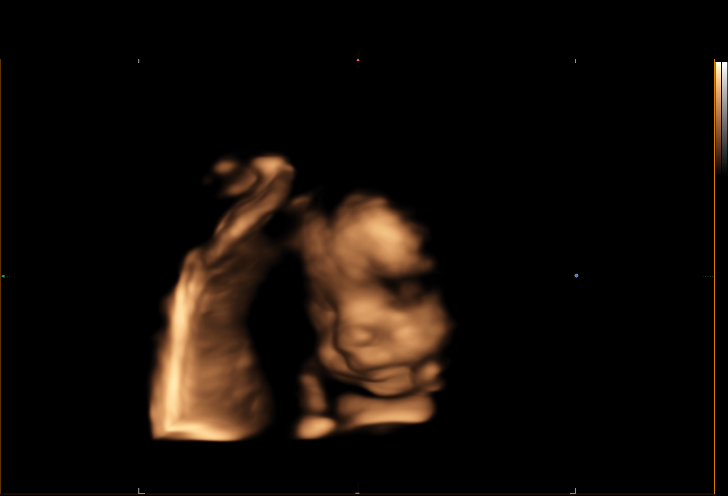
[im 12/44]
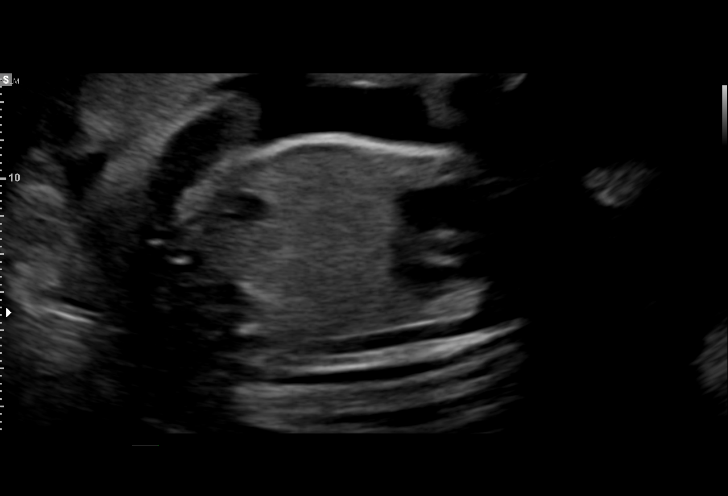
[im 15/44]
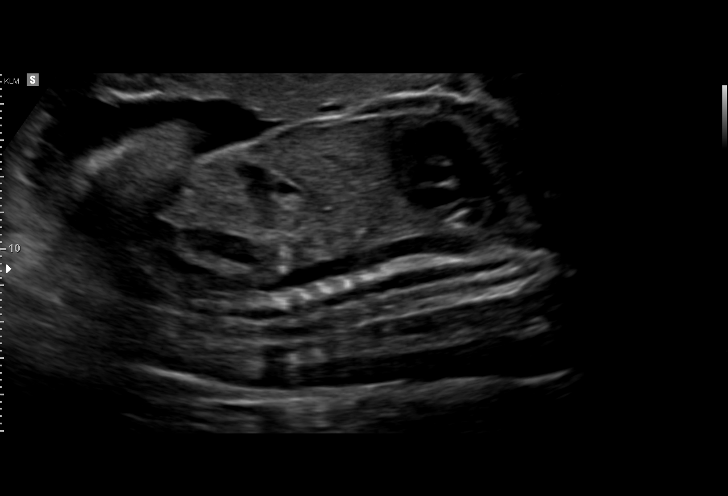
[im 18/44]
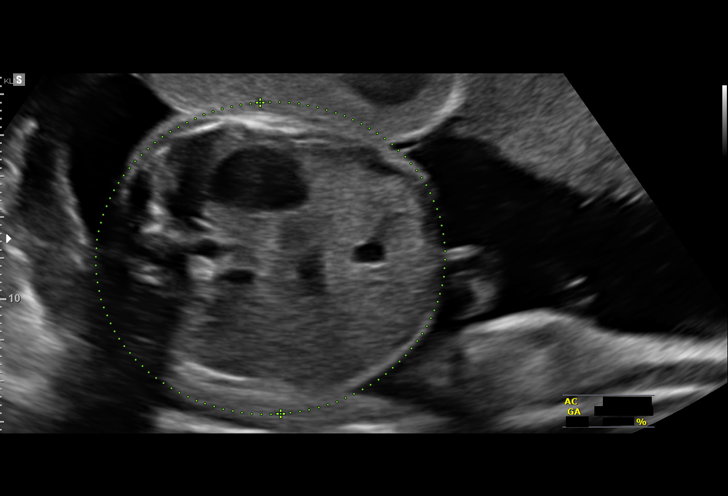
[im 21/44]
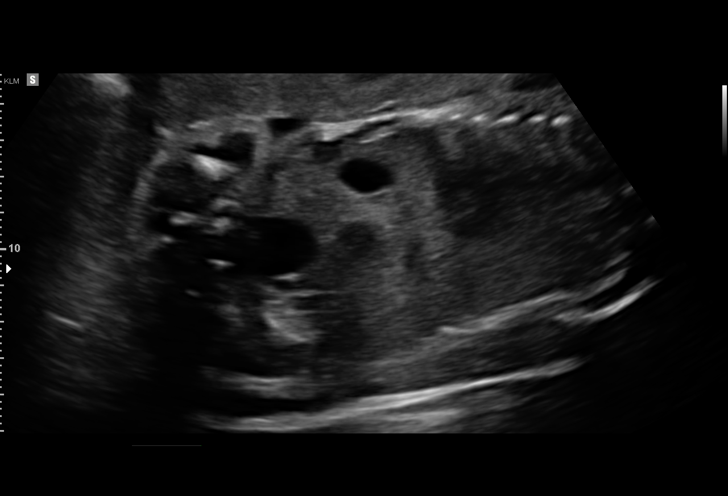
[im 24/44]
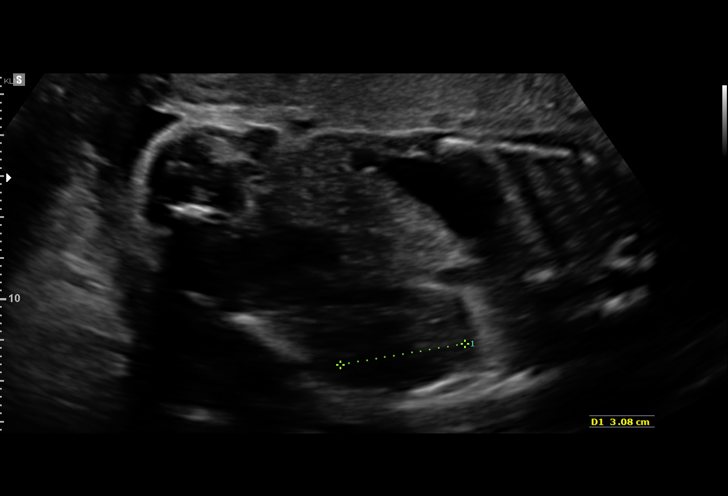
[im 28/44]
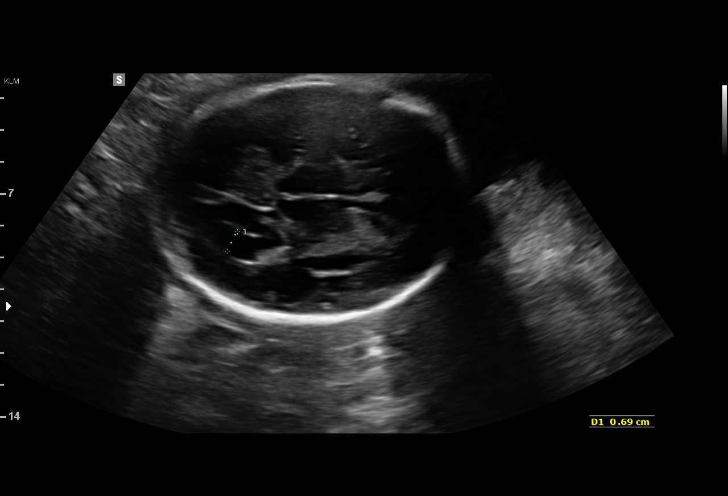
[im 31/44]
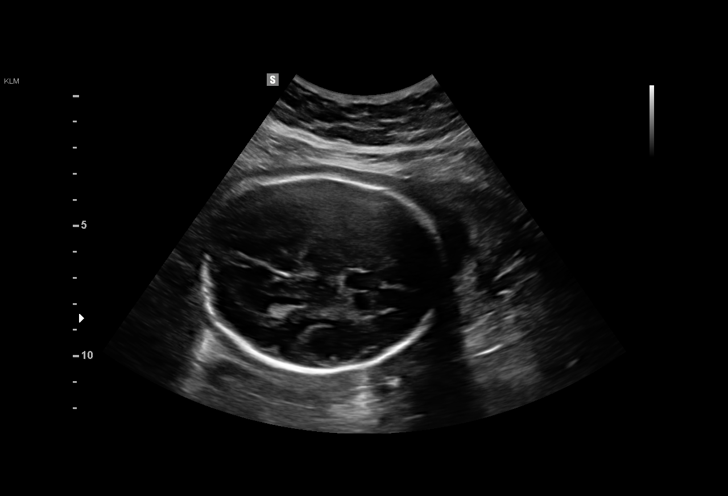
[im 34/44]
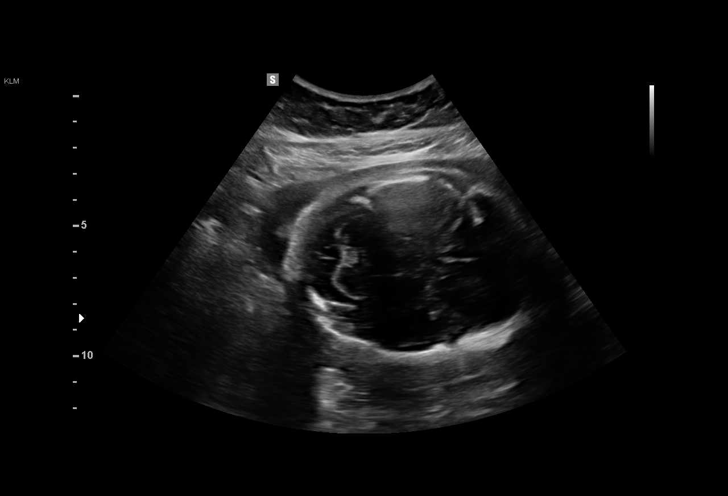
[im 37/44]
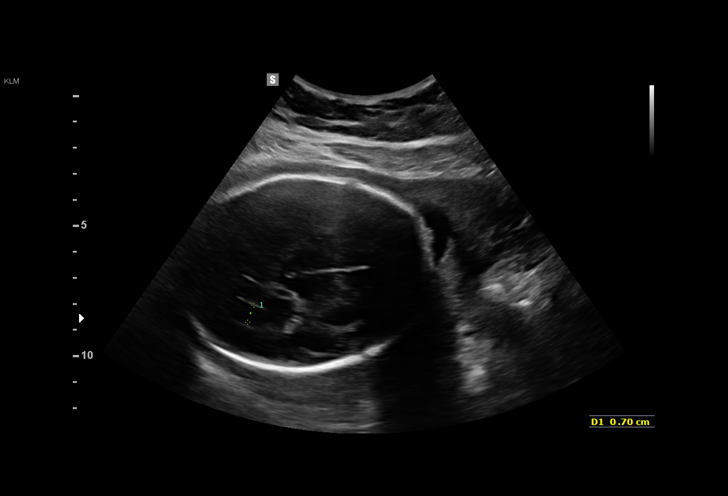
[im 40/44]
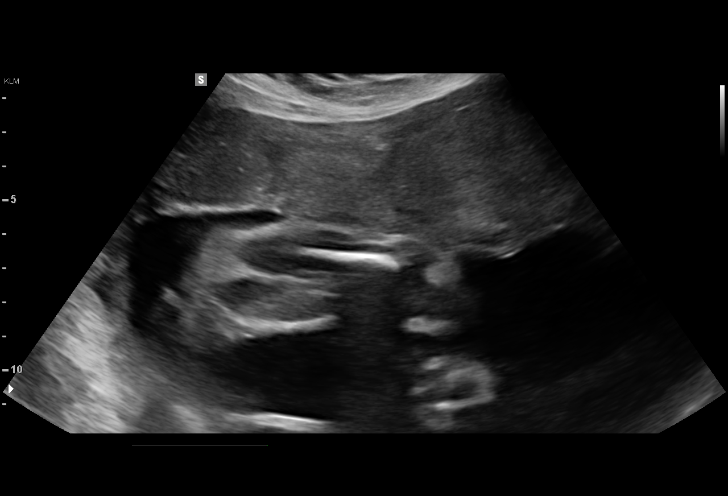
[im 44/44]
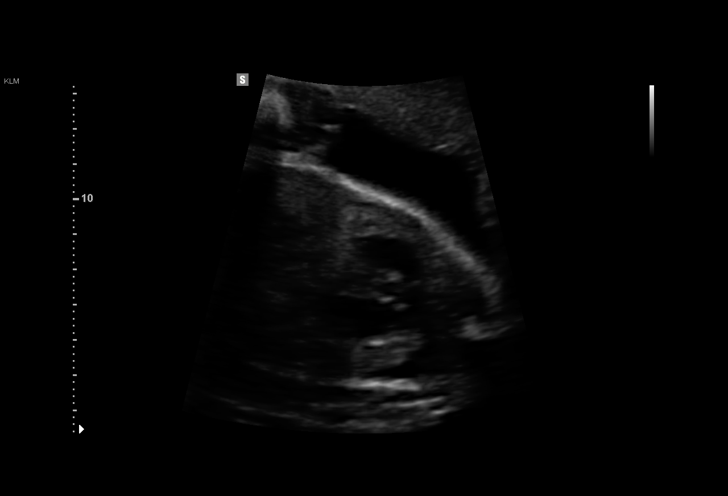

[14 of 28 positions shown; findings below may reference images not displayed]

[REDACTED]-
Faculty Physician

1  EMAAZ COBU            [id number]      [id number]     [id number]
Indications

27 weeks gestation of pregnancy
Follow-up incomplete fetal anatomic            Z36
evaluation
Previous cesarean delivery, antepartum
2 vessel umbilical cord
OB History

Blood Type:            Height:  5'6"   Weight (lb):  224      BMI:
Gravidity:    3         Term:   1        Prem:   0        SAB:   1
TOP:          0       Ectopic:  0        Living: 1
Fetal Evaluation

Num Of Fetuses:     1
Fetal Heart         159
Rate(bpm):
Cardiac Activity:   Observed
Presentation:       Cephalic
Placenta:           Anterior, above cervical os
P. Cord Insertion:  Visualized, central

Amniotic Fluid
AFI FV:      Subjectively within normal limits

Largest Pocket(cm)
5.0
Biometry

BPD:      73.6  mm     G. Age:  29w 4d         93  %    CI:        69.46   %   70 - 86
FL/HC:      19.3   %   18.6 -
HC:      281.9  mm     G. Age:  30w 6d       > 97  %    HC/AC:      1.14       1.05 -
AC:      246.7  mm     G. Age:  28w 6d         83  %    FL/BPD:     74.0   %   71 - 87
FL:       54.5  mm     G. Age:  28w 6d         76  %    FL/AC:      22.1   %   20 - 24

Est. FW:    9887  gm    2 lb 15 oz      80  %
Gestational Age

LMP:           27w 3d       Date:   12/14/14                 EDD:   09/20/15
U/S Today:     29w 4d                                        EDD:   09/05/15
Best:          27w 3d    Det. By:   LMP  (12/14/14)          EDD:   09/20/15
Anatomy

Cranium:               Appears normal         Aortic Arch:            Appears normal
Cavum:                 Appears normal         Ductal Arch:            Appears normal
Ventricles:            Appears normal         Diaphragm:              Appears normal
Choroid Plexus:        Previously seen        Stomach:                Appears normal, left
sided
Cerebellum:            Appears normal         Abdomen:                Appears normal
Posterior Fossa:       Appears normal         Abdominal Wall:         Previously seen
Nuchal Fold:           Previously seen        Cord Vessels:           2 Vessel Cord
Face:                  Orbits and profile     Kidneys:                Appear normal
previously seen
Lips:                  Previously seen        Bladder:                Appears normal
Thoracic:              Appears normal         Spine:                  Previously seen
Heart:                 Appears normal         Upper Extremities:      Previously seen
(4CH, axis, and
situs)
RVOT:                  Appears normal         Lower Extremities:      Previously seen
LVOT:                  Appears normal

Other:  Male gender previously seen. Heels and 5th digit previously seen.
Technically difficult due to maternal habitus.
Cervix Uterus Adnexa

Cervix
Length:            4.2  cm.
Normal appearance by transabdominal scan.
Impression

SIUP at 27+0 weeks
Single umbilical artery
All other interval fetal anatomy was seen and appeared
normal; anatomic survey complete; lateral vents appeared
normal and measured within the normal range today
Normal amniotic fluid volume
Appropriate interval growth with EFW at the 80th %tile
Recommendations

Follow-up ultrasound for growth in 6-7 weeks ([facility])

## 2017-07-29 ENCOUNTER — Other Ambulatory Visit: Payer: Self-pay | Admitting: Obstetrics & Gynecology
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
## 2017-10-29 ENCOUNTER — Ambulatory Visit: Payer: Medicaid Other | Admitting: Obstetrics & Gynecology

## 2017-11-01 ENCOUNTER — Ambulatory Visit: Payer: Medicaid Other | Admitting: Obstetrics & Gynecology

## 2017-11-01 ENCOUNTER — Encounter: Payer: Self-pay | Admitting: Obstetrics & Gynecology

## 2017-11-01 VITALS — BP 136/79 | HR 99 | Resp 16 | Ht 67.0 in | Wt 221.0 lb

## 2017-11-01 DIAGNOSIS — N920 Excessive and frequent menstruation with regular cycle: Secondary | ICD-10-CM | POA: Diagnosis not present

## 2017-11-01 NOTE — Progress Notes (Signed)
Patient ID: Sandra Fuller, female   DOB: June 24, 1985, 32 y.o.   MRN: [id number]  Chief Complaint  Patient presents with  . heavy painful cycles    HPI Sandra Fuller is a 32 y.o. female. Single P2 here today with the long standing issues of heavy periods that last about 7 days, with clots. She also has significant dysmenorrhea with her periods and the week prior to and after. 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 She has tried OCPs for about 2 years. They helped initially but have quit helping her issues over the last 3 months. She has tried an IUD, had it in for about 18 months but had pain with it.  She had a normal gyn u/s 6/18. HPI  Past Medical History:  Diagnosis Date  . Headache    Migraines  . Hypertension     Past Surgical History:  Procedure Laterality Date  . ARTHROSCOPIC REPAIR ACL Bilateral   . CESAREAN SECTION    . CESAREAN SECTION N/A 08/17/2015   Procedure: CESAREAN SECTION;  Surgeon: Levie Heritage, DO;  Location: [facility] SU[facility];  Service: Obstetrics;  Laterality: N/A;  . NOSE SURGERY     broken nose  . WISDOM TOOTH EXTRACTION      Family History  Problem Relation Age of Onset  . Hypertension Mother   . Diabetes Mother        gestational  . Preterm labor Mother   . Fibroids Mother   . Hypertension Father   . Heart disease Father   . Cancer - Lung Paternal Grandmother   . ADD / ADHD Daughter   . Asthma Daughter     Social History Social History   Tobacco Use  . Smoking status: Never Smoker  . Smokeless tobacco: Never Used  Substance Use Topics  . Alcohol use: No    Alcohol/week: 0.0 standard drinks    Comment: occassionally; not now  . Drug use: No    Allergies  Allergen Reactions  . Adhesive [Tape] Rash    Current Outpatient Medications  Medication Sig Dispense Refill  . HYDROcodone-acetaminophen (NORCO) 10-325 MG tablet TAKE ONE TABLET BY MOUTH FIVE TIMES A DAY  AS NEEDED  0  . IBU 800 MG tablet Take 1 tablet (800 mg total) by mouth every 8 (eight) hours as needed. 60 tablet 1  . Levonorgestrel-Ethinyl Estradiol (AMETHIA,CAMRESE) 0.15-0.03 &0.01 MG tablet Take 1 tablet by mouth daily. 1 Package 5  . ondansetron (ZOFRAN) 8 MG tablet Take by mouth every 8 (eight) hours as needed for nausea or vomiting.    . topiramate (TOPAMAX) 50 MG tablet Take 50 mg by mouth every evening.  0   No current facility-administered medications for this visit.     Review of Systems Review of Systems She has had a BTL. She is in a monogamous relationship. She has had 2 previous cesarean sections.   Blood pressure 136/79, pulse 99, resp. rate 16, height 5\' 7"  (1.702 m), weight 221 lb (100.2 kg), last menstrual period 10/23/2017.  Physical Exam Physical Exam  Breathing, conversing, and ambulating normally Obese, well hydrated White female, no apparent distress Abd- benign Cervix- normal appearance Bimanual exam- normal size and shape, anteverted, mobile, non-tender, normal adnexal exam   Data Reviewed Pap smear normal 2019  Assessment    Dysmenorrhea, menorrhagia    Plan    Check gyn u/s, TSH, CBC Come back  in 4 weeks       Myrlene Riera C Alece Koppel 11/01/2017, 1:28 PM

## 2017-11-02 LAB — CBC
HEMATOCRIT: 39.3 % (ref 35.0–45.0)
HEMOGLOBIN: 13.5 g/dL (ref 11.7–15.5)
MCH: 29.9 pg (ref 27.0–33.0)
MCHC: 34.4 g/dL (ref 32.0–36.0)
MCV: 87.1 fL (ref 80.0–100.0)
MPV: 11.3 fL (ref 7.5–12.5)
Platelets: 326 10*3/uL (ref 140–400)
RBC: 4.51 10*6/uL (ref 3.80–5.10)
RDW: 12.2 % (ref 11.0–15.0)
WBC: 11.7 10*3/uL — ABNORMAL HIGH (ref 3.8–10.8)

## 2017-11-02 LAB — TSH: TSH: 0.9 mIU/L

## 2017-11-05 ENCOUNTER — Ambulatory Visit (INDEPENDENT_AMBULATORY_CARE_PROVIDER_SITE_OTHER): Payer: Medicaid Other

## 2017-11-05 DIAGNOSIS — N83202 Unspecified ovarian cyst, left side: Secondary | ICD-10-CM

## 2017-11-05 DIAGNOSIS — N920 Excessive and frequent menstruation with regular cycle: Secondary | ICD-10-CM

## 2017-11-08 ENCOUNTER — Encounter: Payer: Self-pay | Admitting: *Deleted

## 2017-11-08 ENCOUNTER — Telehealth: Payer: Self-pay | Admitting: *Deleted

## 2017-11-08 NOTE — Telephone Encounter (Signed)
Pt notified via my chart of U/S results.  She will need a repeat U/S in 6-12 weeks to f/u lt hemorrhagic cyst.  This is to be done the week following her period.

## 2017-12-04 ENCOUNTER — Telehealth: Payer: Self-pay | Admitting: *Deleted

## 2017-12-04 NOTE — Telephone Encounter (Signed)
Left patient a message to call and [facility]

## 2018-02-14 ENCOUNTER — Other Ambulatory Visit: Payer: Self-pay | Admitting: Obstetrics & Gynecology

## 2018-02-14 DIAGNOSIS — Z3041 Encounter for surveillance of contraceptive pills: Secondary | ICD-10-CM

## 2018-09-03 HISTORY — PX: SHOULDER SURGERY: SHX246

## 2019-06-16 ENCOUNTER — Telehealth: Payer: Self-pay | Admitting: Neurology

## 2019-06-16 ENCOUNTER — Other Ambulatory Visit: Payer: Self-pay

## 2019-06-16 ENCOUNTER — Encounter: Payer: Self-pay | Admitting: Neurology

## 2019-06-16 ENCOUNTER — Ambulatory Visit: Payer: Medicaid Other | Admitting: Neurology

## 2019-06-16 VITALS — BP 146/98 | HR 78 | Ht 67.0 in | Wt 237.0 lb

## 2019-06-16 DIAGNOSIS — Z79899 Other long term (current) drug therapy: Secondary | ICD-10-CM | POA: Diagnosis not present

## 2019-06-16 DIAGNOSIS — G43109 Migraine with aura, not intractable, without status migrainosus: Secondary | ICD-10-CM | POA: Diagnosis not present

## 2019-06-16 DIAGNOSIS — G43711 Chronic migraine without aura, intractable, with status migrainosus: Secondary | ICD-10-CM

## 2019-06-16 DIAGNOSIS — G43709 Chronic migraine without aura, not intractable, without status migrainosus: Secondary | ICD-10-CM | POA: Insufficient documentation

## 2019-06-16 NOTE — Progress Notes (Signed)
Botox consent reviewed and signed by patient.

## 2019-06-16 NOTE — Telephone Encounter (Signed)
I called patient and LVM asking her to call back to discuss appointment.   Medicaid is usually quick with decisions, so 6/30 should allow enough time for approval and for me to schedule delivery with [facility].

## 2019-06-16 NOTE — Patient Instructions (Addendum)
Start Botox, Consider Ajovy or Emgality in the future   There is increased risk for stroke in women with migraine with aura and a contraindication for the combined contraceptive pill for use by women who have migraine with aura. The risk for women with migraine without aura is lower. However other risk factors like smoking are far more likely to increase stroke risk than migraine. There is a recommendation for no smoking and for the use of OCPs without estrogen such as progestogen only pills particularly for women with migraine with aura.Marland Kitchen People who have migraine headaches with auras may be 3 times more likely to have a stroke caused by a blood clot, compared to migraine patients who don't see auras. Women who take hormone-replacement therapy may be 30 percent more likely to suffer a clot-based stroke than women not taking medication containing estrogen. Other risk factors like smoking and high blood pressure may be  much more important.   Galcanezumab injection What is this medicine? GALCANEZUMAB (gal ka NEZ ue mab) is used to prevent migraines and treat cluster headaches. This medicine may be used for other purposes; ask your health care provider or pharmacist if you have questions. COMMON BRAND NAME(S): Emgality What should I tell my health care provider before I take this medicine? They need to know if you have any of these conditions:  an unusual or allergic reaction to galcanezumab, other medicines, foods, dyes, or preservatives  pregnant or trying to get pregnant  breast-feeding How should I use this medicine? This medicine is for injection under the skin. You will be taught how to prepare and give this medicine. Use exactly as directed. Take your medicine at regular intervals. Do not take your medicine more often than directed. It is important that you put your used needles and syringes in a special sharps container. Do not put them in a trash can. If you do not have a sharps container,  call your pharmacist or healthcare provider to get one. Talk to your pediatrician regarding the use of this medicine in children. Special care may be needed. Overdosage: If you think you have taken too much of this medicine contact a poison control center or emergency room at once. NOTE: This medicine is only for you. Do not share this medicine with others. What if I miss a dose? If you miss a dose, take it as soon as you can. If it is almost time for your next dose, take only that dose. Do not take double or extra doses. What may interact with this medicine? Interactions are not expected. This list may not describe all possible interactions. Give your health care provider a list of all the medicines, herbs, non-prescription drugs, or dietary supplements you use. Also tell them if you smoke, drink alcohol, or use illegal drugs. Some items may interact with your medicine. What should I watch for while using this medicine? Tell your doctor or healthcare professional if your symptoms do not start to get better or if they get worse. What side effects may I notice from receiving this medicine? Side effects that you should report to your doctor or health care professional as soon as possible:  allergic reactions like skin rash, itching or hives, swelling of the face, lips, or tongue Side effects that usually do not require medical attention (report these to your doctor or health care professional if they continue or are bothersome):  pain, redness, or irritation at site where injected This list may not describe all possible side  effects. Call your doctor for medical advice about side effects. You may report side effects to FDA at 1-800-FDA-1088. Where should I keep my medicine? Keep out of the reach of children. You will be instructed on how to store this medicine. Throw away any unused medicine after the expiration date on the label. NOTE: This sheet is a summary. It may not cover all possible  information. If you have questions about this medicine, talk to your doctor, pharmacist, or health care provider.  2020 [employer] Standard (2017-06-06 12:03:23) Sandra Fuller injection What is this medicine? FREMANEZUMAB (fre ma NEZ ue mab) is used to prevent migraine headaches. This medicine may be used for other purposes; ask your health care provider or pharmacist if you have questions. COMMON BRAND NAME(S): AJOVY What should I tell my health care provider before I take this medicine? They need to know if you have any of these conditions:  an unusual or allergic reaction to fremanezumab, other medicines, foods, dyes, or preservatives  pregnant or trying to get pregnant  breast-feeding How should I use this medicine? This medicine is for injection under the skin. You will be taught how to prepare and give this medicine. Use exactly as directed. Take your medicine at regular intervals. Do not take your medicine more often than directed. It is important that you put your used needles and syringes in a special sharps container. Do not put them in a trash can. If you do not have a sharps container, call your pharmacist or healthcare provider to get one. Talk to your pediatrician regarding the use of this medicine in children. Special care may be needed. Overdosage: If you think you have taken too much of this medicine contact a poison control center or emergency room at once. NOTE: This medicine is only for you. Do not share this medicine with others. What if I miss a dose? If you miss a dose, take it as soon as you can. If it is almost time for your next dose, take only that dose. Do not take double or extra doses. What may interact with this medicine? Interactions are not expected. This list may not describe all possible interactions. Give your health care provider a list of all the medicines, herbs, non-prescription drugs, or dietary supplements you use. Also tell them if you smoke,  drink alcohol, or use illegal drugs. Some items may interact with your medicine. What should I watch for while using this medicine? Tell your doctor or healthcare professional if your symptoms do not start to get better or if they get worse. What side effects may I notice from receiving this medicine? Side effects that you should report to your doctor or health care professional as soon as possible:  allergic reactions like skin rash, itching or hives, swelling of the face, lips, or tongue Side effects that usually do not require medical attention (report these to your doctor or health care professional if they continue or are bothersome):  pain, redness, or irritation at site where injected This list may not describe all possible side effects. Call your doctor for medical advice about side effects. You may report side effects to FDA at 1-800-FDA-1088. Where should I keep my medicine? Keep out of the reach of children. You will be instructed on how to store this medicine. Throw away any unused medicine after the expiration date on the label. NOTE: This sheet is a summary. It may not cover all possible information. If you have questions about this medicine, talk to  your doctor, pharmacist, or health care provider.  2020 [employer] Standard (2016-09-18 17:22:56)   OnabotulinumtoxinA injection (Medical Use) What is this medicine? ONABOTULINUMTOXINA (o na BOTT you lye num tox in eh) is a neuro-muscular blocker. This medicine is used to treat crossed eyes, eyelid spasms, severe neck muscle spasms, ankle and toe muscle spasms, and elbow, wrist, and finger muscle spasms. It is also used to treat excessive underarm sweating, to prevent chronic migraine headaches, and to treat loss of bladder control due to neurologic conditions such as multiple sclerosis or spinal cord injury. This medicine may be used for other purposes; ask your health care provider or pharmacist if you have questions. COMMON  BRAND NAME(S): Botox What should I tell my health care provider before I take this medicine? They need to know if you have any of these conditions:  breathing problems  cerebral palsy spasms  difficulty urinating  heart problems  history of surgery where this medicine is going to be used  infection at the site where this medicine is going to be used  myasthenia gravis or other neurologic disease  nerve or muscle disease  surgery plans  take medicines that treat or prevent blood clots  thyroid problems  an unusual or allergic reaction to botulinum toxin, albumin, other medicines, foods, dyes, or preservatives  pregnant or trying to get pregnant  breast-feeding How should I use this medicine? This medicine is for injection into a muscle. It is given by a health care professional in a hospital or clinic setting. Talk to your pediatrician regarding the use of this medicine in children. While this drug may be prescribed for children as young as 81 years old for selected conditions, precautions do apply. Overdosage: If you think you have taken too much of this medicine contact a poison control center or emergency room at once. NOTE: This medicine is only for you. Do not share this medicine with others. What if I miss a dose? This does not apply. What may interact with this medicine?  aminoglycoside antibiotics like gentamicin, neomycin, tobramycin  muscle relaxants  other botulinum toxin injections This list may not describe all possible interactions. Give your health care provider a list of all the medicines, herbs, non-prescription drugs, or dietary supplements you use. Also tell them if you smoke, drink alcohol, or use illegal drugs. Some items may interact with your medicine. What should I watch for while using this medicine? Visit your doctor for regular check ups. This medicine will cause weakness in the muscle where it is injected. Tell your doctor if you feel  unusually weak in other muscles. Get medical help right away if you have problems with breathing, swallowing, or talking. This medicine might make your eyelids droop or make you see blurry or double. If you have weak muscles or trouble seeing do not drive a car, use machinery, or do other dangerous activities. This medicine contains albumin from human blood. It may be possible to pass an infection in this medicine, but no cases have been reported. Talk to your doctor about the risks and benefits of this medicine. If your activities have been limited by your condition, go back to your regular routine slowly after treatment with this medicine. What side effects may I notice from receiving this medicine? Side effects that you should report to your doctor or health care professional as soon as possible:  allergic reactions like skin rash, itching or hives, swelling of the face, lips, or tongue  breathing problems  changes  in vision  chest pain or tightness  eye irritation, pain  fast, irregular heartbeat  infection  numbness  speech problems  swallowing problems  unusual weakness Side effects that usually do not require medical attention (report to your doctor or health care professional if they continue or are bothersome):  bruising or pain at site where injected  drooping eyelid  dry eyes or mouth  headache  muscles aches, pains  sensitivity to light  tearing This list may not describe all possible side effects. Call your doctor for medical advice about side effects. You may report side effects to FDA at 1-800-FDA-1088. Where should I keep my medicine? This drug is given in a hospital or clinic and will not be stored at home. NOTE: This sheet is a summary. It may not cover all possible information. If you have questions about this medicine, talk to your doctor, pharmacist, or health care provider.  2020 [employer] Standard (2017-06-25 14:21:42)   Sandra Fuller  injection What is this medicine? FREMANEZUMAB (fre ma NEZ ue mab) is used to prevent migraine headaches. This medicine may be used for other purposes; ask your health care provider or pharmacist if you have questions. COMMON BRAND NAME(S): AJOVY What should I tell my health care provider before I take this medicine? They need to know if you have any of these conditions:  an unusual or allergic reaction to fremanezumab, other medicines, foods, dyes, or preservatives  pregnant or trying to get pregnant  breast-feeding How should I use this medicine? This medicine is for injection under the skin. You will be taught how to prepare and give this medicine. Use exactly as directed. Take your medicine at regular intervals. Do not take your medicine more often than directed. It is important that you put your used needles and syringes in a special sharps container. Do not put them in a trash can. If you do not have a sharps container, call your pharmacist or healthcare provider to get one. Talk to your pediatrician regarding the use of this medicine in children. Special care may be needed. Overdosage: If you think you have taken too much of this medicine contact a poison control center or emergency room at once. NOTE: This medicine is only for you. Do not share this medicine with others. What if I miss a dose? If you miss a dose, take it as soon as you can. If it is almost time for your next dose, take only that dose. Do not take double or extra doses. What may interact with this medicine? Interactions are not expected. This list may not describe all possible interactions. Give your health care provider a list of all the medicines, herbs, non-prescription drugs, or dietary supplements you use. Also tell them if you smoke, drink alcohol, or use illegal drugs. Some items may interact with your medicine. What should I watch for while using this medicine? Tell your doctor or healthcare professional if  your symptoms do not start to get better or if they get worse. What side effects may I notice from receiving this medicine? Side effects that you should report to your doctor or health care professional as soon as possible:  allergic reactions like skin rash, itching or hives, swelling of the face, lips, or tongue Side effects that usually do not require medical attention (report these to your doctor or health care professional if they continue or are bothersome):  pain, redness, or irritation at site where injected This list may not describe all possible side  effects. Call your doctor for medical advice about side effects. You may report side effects to FDA at 1-800-FDA-1088. Where should I keep my medicine? Keep out of the reach of children. You will be instructed on how to store this medicine. Throw away any unused medicine after the expiration date on the label. NOTE: This sheet is a summary. It may not cover all possible information. If you have questions about this medicine, talk to your doctor, pharmacist, or health care provider.  2020 [employer] Standard (2016-09-18 17:22:56)

## 2019-06-16 NOTE — Telephone Encounter (Signed)
Patient called back and agreed to take the 6/30 appointment at 330. Can you create the opening to schedule patient?
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
## 2019-06-16 NOTE — Telephone Encounter (Signed)
Offer her June 30th at 330 unless that is blocked for any other reason. We should be able to approve her by then? Let me know thanks!

## 2019-06-16 NOTE — Telephone Encounter (Signed)
I scheduled the pt for June 30th 2021 @ 3:30 pm.

## 2019-06-16 NOTE — Telephone Encounter (Signed)
I called Medicaid to initiate PA. I spoke with Cala Bradford who helped me get the PA started. PA request is for [id number] and [id number] for G43.711. 200U, quantity of 1 every 90 days. Patient has tried Topamax, Aimovig and Metoprolol, meeting 3 of the required drug class categories. Cala Bradford advised that PA results should be accessible within 24 hours. Pending PA #[id number]. Call reference [id number].

## 2019-06-16 NOTE — Telephone Encounter (Signed)
Place patient on my schedule for botox, we can use a follow up or botox or if I am booked more than 3 weeks then I will make her a spot let me know

## 2019-06-16 NOTE — Telephone Encounter (Signed)
No openings within next 3-4 weeks. Please advise. Botox consent was signed today by patient.

## 2019-06-16 NOTE — Telephone Encounter (Signed)
I scheduled the pt.

## 2019-06-19 LAB — CBC
Hematocrit: 40.9 % (ref 34.0–46.6)
Hemoglobin: 13.7 g/dL (ref 11.1–15.9)
MCH: 29.5 pg (ref 26.6–33.0)
MCHC: 33.5 g/dL (ref 31.5–35.7)
MCV: 88 fL (ref 79–97)
Platelets: 313 10*3/uL (ref 150–450)
RBC: 4.65 x10E6/uL (ref 3.77–5.28)
RDW: 12.7 % (ref 11.7–15.4)
WBC: 8.6 10*3/uL (ref 3.4–10.8)

## 2019-06-19 LAB — COMPREHENSIVE METABOLIC PANEL
ALT: 12 IU/L (ref 0–32)
AST: 11 IU/L (ref 0–40)
Albumin/Globulin Ratio: 1.6 (ref 1.2–2.2)
Albumin: 4 g/dL (ref 3.8–4.8)
Alkaline Phosphatase: 95 IU/L (ref 48–121)
BUN/Creatinine Ratio: 14 (ref 9–23)
BUN: 8 mg/dL (ref 6–20)
Bilirubin Total: 0.4 mg/dL (ref 0.0–1.2)
CO2: 19 mmol/L — ABNORMAL LOW (ref 20–29)
Calcium: 9.3 mg/dL (ref 8.7–10.2)
Chloride: 105 mmol/L (ref 96–106)
Creatinine, Ser: 0.59 mg/dL (ref 0.57–1.00)
GFR calc Af Amer: 138 mL/min/{1.73_m2} (ref >59)
GFR calc non Af Amer: 120 mL/min/{1.73_m2} (ref >59)
Globulin, Total: 2.5 g/dL (ref 1.5–4.5)
Glucose: 94 mg/dL (ref 65–99)
Potassium: 4.5 mmol/L (ref 3.5–5.2)
Sodium: 140 mmol/L (ref 134–144)
Total Protein: 6.5 g/dL (ref 6.0–8.5)

## 2019-06-19 LAB — TSH: TSH: 2.2 u[IU]/mL (ref 0.450–4.500)

## 2019-06-19 LAB — TOPIRAMATE LEVEL: Topiramate Lvl: <1 ug/mL — ABNORMAL LOW (ref 2.0–25.0)

## 2019-06-19 NOTE — Telephone Encounter (Signed)
I called [facility] [employer] [phone]) to check the status of the PA. I spoke with Lia who says it was denied. Medicaid requires that patients try 1 drug from 3/4 of these categories: Tricyclic antidepressants, Calcium Channel Blockers, Beta Blockers, Anticonvulsants. Patient has tried 2 out of the 3 needed (Topamax and Metoprolol). She needs to try something like amitriptyline or amoxapine (tricyclic antidepressant) OR Verapamil or Amlodipine (Calcium Channel Blocker) for Medicaid to approve it. Reference #[id number].

## 2019-06-19 NOTE — Telephone Encounter (Signed)
I will use botox samples on her at next appointment and at that time start her on one of these meds and then we can reapply for botox approval. Let patient know please thanks

## 2019-06-19 NOTE — Telephone Encounter (Signed)
I called the patient to let her know. She is okay with this.

## 2019-07-01 NOTE — Progress Notes (Signed)
Consent Form Botulism Toxin Injection For Chronic Migraine  This is our first botox. 34 year old with chronic intractable migraines, Start Botox, Consider Ajovy or Emgality in the future, Continue Ubrelvy acutely,  STOP AImovig, will start Botox approval   Reviewed orally with patient, additionally signature is on file:  Botulism toxin has been approved by the Federal drug administration for treatment of chronic migraine. Botulism toxin does not cure chronic migraine and it may not be effective in some patients.  The administration of botulism toxin is accomplished by injecting a small amount of toxin into the muscles of the neck and head. Dosage must be titrated for each individual. Any benefits resulting from botulism toxin tend to wear off after 3 months with a repeat injection required if benefit is to be maintained. Injections are usually done every 3-4 months with maximum effect peak achieved by about 2 or 3 weeks. Botulism toxin is expensive and you should be sure of what costs you will incur resulting from the injection.  The side effects of botulism toxin use for chronic migraine may include:   -Transient, and usually mild, facial weakness with facial injections  -Transient, and usually mild, head or neck weakness with head/neck injections  -Reduction or loss of forehead facial animation due to forehead muscle weakness  -Eyelid drooping  -Dry eye  -Pain at the site of injection or bruising at the site of injection  -Double vision  -Potential unknown long term risks  Contraindications: You should not have Botox if you are pregnant, nursing, allergic to albumin, have an infection, skin condition, or muscle weakness at the site of the injection, or have myasthenia gravis, Lambert-Eaton syndrome, or ALS.  It is also possible that as with any injection, there may be an allergic reaction or no effect from the medication. Reduced effectiveness after repeated injections is sometimes seen  and rarely infection at the injection site may occur. All care will be taken to prevent these side effects. If therapy is given over a long time, atrophy and wasting in the muscle injected may occur. Occasionally the patient's become refractory to treatment because they develop antibodies to the toxin. In this event, therapy needs to be modified.  I have read the above information and consent to the administration of botulism toxin.    BOTOX PROCEDURE NOTE FOR MIGRAINE HEADACHE    Contraindications and precautions discussed with patient(above). Aseptic procedure was observed and patient tolerated procedure. Procedure performed by Dr. Artemio Aly  The condition has existed for more than 6 months, and pt does not have a diagnosis of ALS, Myasthenia Gravis or Lambert-Eaton Syndrome.  Risks and benefits of injections discussed and pt agrees to proceed with the procedure.  Written consent obtained  These injections are medically necessary. Pt  receives good benefits from these injections. These injections do not cause sedations or hallucinations which the oral therapies may cause.  Description of procedure:  The patient was placed in a sitting position. The standard protocol was used for Botox as follows, with 5 units of Botox injected at each site:   -Procerus muscle, midline injection  -Corrugator muscle, bilateral injection  -Frontalis muscle, bilateral injection, with 2 sites each side, medial injection was performed in the upper one third of the frontalis muscle, in the region vertical from the medial inferior edge of the superior orbital rim. The lateral injection was again in the upper one third of the forehead vertically above the lateral limbus of the cornea, 1.5 cm lateral to  the medial injection site.  -Temporalis muscle injection, 4 sites, bilaterally. The first injection was 3 cm above the tragus of the ear, second injection site was 1.5 cm to 3 cm up from the first injection site  in line with the tragus of the ear. The third injection site was 1.5-3 cm forward between the first 2 injection sites. The fourth injection site was 1.5 cm posterior to the second injection site.   -Occipitalis muscle injection, 3 sites, bilaterally. The first injection was done one half way between the occipital protuberance and the tip of the mastoid process behind the ear. The second injection site was done lateral and superior to the first, 1 fingerbreadth from the first injection. The third injection site was 1 fingerbreadth superiorly and medially from the first injection site.  -Cervical paraspinal muscle injection, 2 sites, bilateral knee first injection site was 1 cm from the midline of the cervical spine, 3 cm inferior to the lower border of the occipital protuberance. The second injection site was 1.5 cm superiorly and laterally to the first injection site.  -Trapezius muscle injection was performed at 3 sites, bilaterally. The first injection site was in the upper trapezius muscle halfway between the inflection point of the neck, and the acromion. The second injection site was one half way between the acromion and the first injection site. The third injection was done between the first injection site and the inflection point of the neck.   Will return for repeat injection in 3 months.   200 units of Botox was used, any Botox not injected was wasted. The patient tolerated the procedure well, there were no complications of the above procedure.  I spent 45 minutes of face-to-face and non-face-to-face time with patient on the  1. Chronic migraine without aura, with intractable migraine, so stated, with status migrainosus    diagnosis.  This included previsit chart review, lab review, study review, order entry, electronic health record documentation, patient education on the different diagnostic and therapeutic options, counseling and coordination of care, risks and benefits of management,  compliance, or risk factor reduction

## 2019-07-02 ENCOUNTER — Other Ambulatory Visit: Payer: Self-pay

## 2019-07-02 ENCOUNTER — Ambulatory Visit: Payer: Medicaid Other | Admitting: Neurology

## 2019-07-02 DIAGNOSIS — G43711 Chronic migraine without aura, intractable, with status migrainosus: Secondary | ICD-10-CM | POA: Diagnosis not present

## 2019-07-02 NOTE — Progress Notes (Signed)
Botox consent signed 06/16/19 Botox- 200 units x 1 vial [id number] Expiration: 08/2021  Bacteriostatic 0.9% Sodium Chloride- 46mL total Lot: [id number] Expiration: 07/03/2019 NDC: 0973-5329-92  Dx: G43.711 sample

## 2019-07-03 NOTE — Telephone Encounter (Signed)
Changes made to med list. Pt sent two mychart messages. I responded in other message.

## 2019-07-03 NOTE — Addendum Note (Signed)
Addended by: Bertram Savin on: 07/03/2019 12:01 PM   Modules accepted: Orders

## 2019-08-27 ENCOUNTER — Other Ambulatory Visit: Payer: Self-pay | Admitting: Neurology

## 2019-08-27 ENCOUNTER — Telehealth: Payer: Self-pay

## 2019-08-27 DIAGNOSIS — G43711 Chronic migraine without aura, intractable, with status migrainosus: Secondary | ICD-10-CM

## 2019-08-27 MED ORDER — UBRELVY 100 MG PO TABS: 100.0000 mg | ORAL_TABLET | ORAL | 6 refills | Status: DC | PRN

## 2019-08-27 NOTE — Telephone Encounter (Signed)
I submitted the PA request for Ubrelvy 100mg  on C[facility], Key: [id number].   Awaiting determination from [facility]

## 2019-08-28 MED ORDER — RIZATRIPTAN BENZOATE 10 MG PO TABS: ORAL_TABLET | ORAL | 3 refills | Status: DC

## 2019-08-28 NOTE — Telephone Encounter (Signed)
I spoke with Dr Lucia Gaskins and she provided a verbal order for Rizatriptan 10 mg, repeat in 2 hours prn, max 2 tablets/day. #9, refills 3.  Order sent to [facility] pharmacy. Bernita Raisin d/c'd since it's not covered by insurance.

## 2019-08-28 NOTE — Telephone Encounter (Signed)
Thanks, please let her know. Also ask if she wants to try either of those and then we can retry getting ubrelvy after she tries them thanks

## 2019-08-28 NOTE — Addendum Note (Signed)
Addended by: Bertram Savin on: 08/28/2019 03:44 PM   Modules accepted: Orders

## 2019-08-28 NOTE — Telephone Encounter (Signed)
I spoke with the pt and we discussed the Ubrelvy denial. The pt has tried Imitrex in the past. She is willing to try Rizatriptan now as she does not have anything now to take acutely. We discussed the instructions for the Rizatriptan. She verbalized appreciation for the call.

## 2019-08-28 NOTE — Telephone Encounter (Signed)
DENIED [facility] may consider approval of this drug after a trial of 2 preferred triptans, rizatriptan AND sumatriptan.

## 2019-08-28 NOTE — Telephone Encounter (Signed)
Noted I will ask her.

## 2019-10-14 ENCOUNTER — Telehealth: Payer: Self-pay | Admitting: Neurology

## 2019-10-14 ENCOUNTER — Ambulatory Visit: Payer: Medicaid Other | Admitting: Neurology

## 2019-10-14 DIAGNOSIS — G43711 Chronic migraine without aura, intractable, with status migrainosus: Secondary | ICD-10-CM

## 2019-10-14 MED ORDER — AJOVY 225 MG/1.5ML ~~LOC~~ SOAJ: 225.0000 mg | SUBCUTANEOUS | 11 refills | Status: AC

## 2019-10-14 MED ORDER — RIZATRIPTAN BENZOATE 10 MG PO TABS: ORAL_TABLET | ORAL | 11 refills | Status: AC

## 2019-10-14 NOTE — Progress Notes (Signed)
Botox- 200 units x 1 vial Lot: [id number] Expiration: 08/2021  Bacteriostatic 0.9% Sodium Chloride- 11mL total Lot: [id number] Expiration: 10/02/2020 NDC: 2542-7062-37  Dx: G43.711 sample

## 2019-10-14 NOTE — Telephone Encounter (Signed)
Per Dr. Lucia Gaskins, disregard message. Patient would like to try Ajovy instead. She failed Aimovig.

## 2019-10-14 NOTE — Telephone Encounter (Signed)
So patient will not be receiving Botox, she wants to try Ajovy, correct?

## 2019-10-14 NOTE — Progress Notes (Signed)
Consent Form Botulism Toxin Injection For Chronic Migraine  This is our second botox. 34 year old with chronic intractable migraines, Start Botox, Consider Ajovy or Emgality in the future, Continue Ubrelvy acutely,  STOPPED AImovig(failed), will try Ajovy instead of botox in the future, we had a discussion she would like to try the Ajovy and not botox.    Reviewed orally with patient, additionally signature is on file:  Botulism toxin has been approved by the Federal drug administration for treatment of chronic migraine. Botulism toxin does not cure chronic migraine and it may not be effective in some patients.  The administration of botulism toxin is accomplished by injecting a small amount of toxin into the muscles of the neck and head. Dosage must be titrated for each individual. Any benefits resulting from botulism toxin tend to wear off after 3 months with a repeat injection required if benefit is to be maintained. Injections are usually done every 3-4 months with maximum effect peak achieved by about 2 or 3 weeks. Botulism toxin is expensive and you should be sure of what costs you will incur resulting from the injection.  The side effects of botulism toxin use for chronic migraine may include:   -Transient, and usually mild, facial weakness with facial injections  -Transient, and usually mild, head or neck weakness with head/neck injections  -Reduction or loss of forehead facial animation due to forehead muscle weakness  -Eyelid drooping  -Dry eye  -Pain at the site of injection or bruising at the site of injection  -Double vision  -Potential unknown long term risks  Contraindications: You should not have Botox if you are pregnant, nursing, allergic to albumin, have an infection, skin condition, or muscle weakness at the site of the injection, or have myasthenia gravis, Lambert-Eaton syndrome, or ALS.  It is also possible that as with any injection, there may be an allergic reaction  or no effect from the medication. Reduced effectiveness after repeated injections is sometimes seen and rarely infection at the injection site may occur. All care will be taken to prevent these side effects. If therapy is given over a long time, atrophy and wasting in the muscle injected may occur. Occasionally the patient's become refractory to treatment because they develop antibodies to the toxin. In this event, therapy needs to be modified.  I have read the above information and consent to the administration of botulism toxin.    BOTOX PROCEDURE NOTE FOR MIGRAINE HEADACHE    Contraindications and precautions discussed with patient(above). Aseptic procedure was observed and patient tolerated procedure. Procedure performed by Dr. Artemio Aly  The condition has existed for more than 6 months, and pt does not have a diagnosis of ALS, Myasthenia Gravis or Lambert-Eaton Syndrome.  Risks and benefits of injections discussed and pt agrees to proceed with the procedure.  Written consent obtained  These injections are medically necessary. Pt  receives good benefits from these injections. These injections do not cause sedations or hallucinations which the oral therapies may cause.  Description of procedure:  The patient was placed in a sitting position. The standard protocol was used for Botox as follows, with 5 units of Botox injected at each site:   -Procerus muscle, midline injection  -Corrugator muscle, bilateral injection  -Frontalis muscle, bilateral injection, with 2 sites each side, medial injection was performed in the upper one third of the frontalis muscle, in the region vertical from the medial inferior edge of the superior orbital rim. The lateral injection was again  in the upper one third of the forehead vertically above the lateral limbus of the cornea, 1.5 cm lateral to the medial injection site.  -Temporalis muscle injection, 4 sites, bilaterally. The first injection was 3 cm  above the tragus of the ear, second injection site was 1.5 cm to 3 cm up from the first injection site in line with the tragus of the ear. The third injection site was 1.5-3 cm forward between the first 2 injection sites. The fourth injection site was 1.5 cm posterior to the second injection site.   -Occipitalis muscle injection, 3 sites, bilaterally. The first injection was done one half way between the occipital protuberance and the tip of the mastoid process behind the ear. The second injection site was done lateral and superior to the first, 1 fingerbreadth from the first injection. The third injection site was 1 fingerbreadth superiorly and medially from the first injection site.  -Cervical paraspinal muscle injection, 2 sites, bilateral knee first injection site was 1 cm from the midline of the cervical spine, 3 cm inferior to the lower border of the occipital protuberance. The second injection site was 1.5 cm superiorly and laterally to the first injection site.  -Trapezius muscle injection was performed at 3 sites, bilaterally. The first injection site was in the upper trapezius muscle halfway between the inflection point of the neck, and the acromion. The second injection site was one half way between the acromion and the first injection site. The third injection was done between the first injection site and the inflection point of the neck.   Meds ordered this encounter  Medications  . rizatriptan (MAXALT) 10 MG tablet    Sig: Take 1 tablet (10 mg) by mouth at the onset of migraine. May repeat in 2 hours if headache returns or persists. Do not take more than 2 tablets in 24 hours.    Dispense:  9 tablet    Refill:  11    This is a 30 day supply.  . Fremanezumab-vfrm (AJOVY) 225 MG/1.5ML SOAJ    Sig: Inject 225 mg into the skin every 30 (thirty) days.    Dispense:  4.5 mL    Refill:  11    Dispense 3 months or maximum allowed by insurance      200 units of Botox was used, any Botox  not injected was wasted. The patient tolerated the procedure well, there were no complications of the above procedure.  I spent 45 minutes of face-to-face and non-face-to-face time with patient on the  1. Chronic migraine without aura, with intractable migraine, so stated, with status migrainosus    diagnosis.  This included previsit chart review, lab review, study review, order entry, electronic health record documentation, patient education on the different diagnostic and therapeutic options, counseling and coordination of care, risks and benefits of management, compliance, or risk factor reduction

## 2019-10-14 NOTE — Telephone Encounter (Signed)
Correct, she will try Ajovy. Thank you!

## 2019-10-14 NOTE — Telephone Encounter (Signed)
Taylor: Have been Using Samples, but we noted we would try to get her authorization approved again. Can we try, she was missing one medication and she may get approved now. If not, we cannot continue to use samples unfortunately and we will have to cancel her next botox thanks Toma Copier [phone])

## 2019-10-16 ENCOUNTER — Telehealth: Payer: Self-pay | Admitting: Neurology

## 2019-10-16 NOTE — Telephone Encounter (Signed)
Received PA request for Ajovy. PA was started via [email or web address]. Key is [id number]. PA was instantly approved starting today 10/16/19-01/14/20. Will fax a copy of the determination to patient's pharmacy.

## 2019-11-06 ENCOUNTER — Ambulatory Visit: Payer: Medicaid Other | Admitting: Obstetrics and Gynecology

## 2019-12-22 MED ORDER — BOTOX 200 UNITS IJ SOLR
INTRAMUSCULAR | 1 refills | Status: AC
Start: 2019-12-22 — End: ?

## 2019-12-22 NOTE — Telephone Encounter (Signed)
Faxed signed PA form to [facility] with notes.

## 2019-12-22 NOTE — Telephone Encounter (Signed)
Received approval from [facility]. The approval is from [facility] (PBM for [facility]) for [phone]. PA #[id number] (12/22/19- 06/19/20). No PA is required for [address].

## 2019-12-22 NOTE — Telephone Encounter (Signed)
Received VM from patient regarding wanting to get Botox because the Ajovy is not working for her. I called the patient to verify that her insurance is remaining the same, patient verified it will be the same for the 2022 year. I will fill out the PA form for [facility].

## 2019-12-30 ENCOUNTER — Telehealth: Payer: Self-pay | Admitting: Neurology

## 2019-12-30 NOTE — Telephone Encounter (Signed)
Botox will be delivered Thursday per patient . From [facility]. Thanks Annabelle Harman

## 2020-01-05 NOTE — Telephone Encounter (Signed)
Completed renewal PA for Ajovy. Key: [id number]. Awaiting determination from [employer].

## 2020-01-05 NOTE — Telephone Encounter (Signed)
PA Case: [id number], Status: Approved, Coverage Starts on: 01/05/2020 12:00:00 AM, Coverage Ends on: 01/04/2021 12:00:00 AM.

## 2020-01-05 NOTE — Telephone Encounter (Signed)
Late entry- (1) 200 unit vial of Botox delivered 12/28 from [facility].

## 2020-01-20 ENCOUNTER — Ambulatory Visit: Payer: Medicaid Other | Admitting: Neurology

## 2020-01-20 DIAGNOSIS — G43711 Chronic migraine without aura, intractable, with status migrainosus: Secondary | ICD-10-CM | POA: Diagnosis not present

## 2020-01-20 NOTE — Progress Notes (Signed)
Botox- 200 units x 1 vial Lot: [id number] Expiration: 09/2022 NDC: 1100-3496-11  Bacteriostatic 0.9% Sodium Chloride- 62mL total Lot: [id number] Expiration: 02/02/2021 NDC: 1225-8346-21  Dx: V47.125 S/P

## 2020-01-20 NOTE — Progress Notes (Signed)
Consent Form Botulism Toxin Injection For Chronic Migraine  01/20/2020: This is our third botox. 35 year old with chronic intractable migraines, Start Botox, Consider Ajovy or Emgality in the future, Continue Ubrelvy acutely,  STOPPED AImovig(failed), Ajovy alone helped a little but she is here again for botox. If insurance will pay for both then she can try both together it usually works great   Reviewed orally with patient, additionally signature is on file:  Botulism toxin has been approved by the Federal drug administration for treatment of chronic migraine. Botulism toxin does not cure chronic migraine and it may not be effective in some patients.  The administration of botulism toxin is accomplished by injecting a small amount of toxin into the muscles of the neck and head. Dosage must be titrated for each individual. Any benefits resulting from botulism toxin tend to wear off after 3 months with a repeat injection required if benefit is to be maintained. Injections are usually done every 3-4 months with maximum effect peak achieved by about 2 or 3 weeks. Botulism toxin is expensive and you should be sure of what costs you will incur resulting from the injection.  The side effects of botulism toxin use for chronic migraine may include:   -Transient, and usually mild, facial weakness with facial injections  -Transient, and usually mild, head or neck weakness with head/neck injections  -Reduction or loss of forehead facial animation due to forehead muscle weakness  -Eyelid drooping  -Dry eye  -Pain at the site of injection or bruising at the site of injection  -Double vision  -Potential unknown long term risks  Contraindications: You should not have Botox if you are pregnant, nursing, allergic to albumin, have an infection, skin condition, or muscle weakness at the site of the injection, or have myasthenia gravis, Lambert-Eaton syndrome, or ALS.  It is also possible that as with any  injection, there may be an allergic reaction or no effect from the medication. Reduced effectiveness after repeated injections is sometimes seen and rarely infection at the injection site may occur. All care will be taken to prevent these side effects. If therapy is given over a long time, atrophy and wasting in the muscle injected may occur. Occasionally the patient's become refractory to treatment because they develop antibodies to the toxin. In this event, therapy needs to be modified.  I have read the above information and consent to the administration of botulism toxin.    BOTOX PROCEDURE NOTE FOR MIGRAINE HEADACHE    Contraindications and precautions discussed with patient(above). Aseptic procedure was observed and patient tolerated procedure. Procedure performed by Dr. Artemio Aly  The condition has existed for more than 6 months, and pt does not have a diagnosis of ALS, Myasthenia Gravis or Lambert-Eaton Syndrome.  Risks and benefits of injections discussed and pt agrees to proceed with the procedure.  Written consent obtained  These injections are medically necessary. Pt  receives good benefits from these injections. These injections do not cause sedations or hallucinations which the oral therapies may cause.  Description of procedure:  The patient was placed in a sitting position. The standard protocol was used for Botox as follows, with 5 units of Botox injected at each site:   -Procerus muscle, midline injection  -Corrugator muscle, bilateral injection  -Frontalis muscle, bilateral injection, with 2 sites each side, medial injection was performed in the upper one third of the frontalis muscle, in the region vertical from the medial inferior edge of the superior orbital rim.  The lateral injection was again in the upper one third of the forehead vertically above the lateral limbus of the cornea, 1.5 cm lateral to the medial injection site.  -Temporalis muscle injection, 4 sites,  bilaterally. The first injection was 3 cm above the tragus of the ear, second injection site was 1.5 cm to 3 cm up from the first injection site in line with the tragus of the ear. The third injection site was 1.5-3 cm forward between the first 2 injection sites. The fourth injection site was 1.5 cm posterior to the second injection site.   -Occipitalis muscle injection, 3 sites, bilaterally. The first injection was done one half way between the occipital protuberance and the tip of the mastoid process behind the ear. The second injection site was done lateral and superior to the first, 1 fingerbreadth from the first injection. The third injection site was 1 fingerbreadth superiorly and medially from the first injection site.  -Cervical paraspinal muscle injection, 2 sites, bilateral knee first injection site was 1 cm from the midline of the cervical spine, 3 cm inferior to the lower border of the occipital protuberance. The second injection site was 1.5 cm superiorly and laterally to the first injection site.  -Trapezius muscle injection was performed at 3 sites, bilaterally. The first injection site was in the upper trapezius muscle halfway between the inflection point of the neck, and the acromion. The second injection site was one half way between the acromion and the first injection site. The third injection was done between the first injection site and the inflection point of the neck.   No orders of the defined types were placed in this encounter.     200 units of Botox was used, any Botox not injected was wasted. The patient tolerated the procedure well, there were no complications of the above procedure.  I spent 45 minutes of face-to-face and non-face-to-face time with patient on the  1. Chronic migraine without aura, with intractable migraine, so stated, with status migrainosus    diagnosis.  This included previsit chart review, lab review, study review, order entry, electronic health  record documentation, patient education on the different diagnostic and therapeutic options, counseling and coordination of care, risks and benefits of management, compliance, or risk factor reduction

## 2020-04-13 ENCOUNTER — Telehealth: Payer: Self-pay | Admitting: Neurology

## 2020-04-13 NOTE — Telephone Encounter (Signed)
Patient has a Botox appointment 4/19. I called [facility] to schedule Botox delivery. I spoke with Alecia Lemming who states that [facility] no longer dispenses Botox. He gave me a list of pharmacies that patient could possibly use, but I will call patient's insurance to check.

## 2020-04-19 NOTE — Telephone Encounter (Signed)
Yes we will have to cancel unfortunately call and speak to patient and let her know what is going on. We may have do something different, you could schedule her a follow up with amy or megan?

## 2020-04-19 NOTE — Telephone Encounter (Signed)
Spoke with Lenord Fellers at [facility] regarding Botox. Lenord Fellers states that Botox is a plan exclusion and is not covered. If patient were to continue with Botox, she would have to pay 100% of the cost and because she has Medicaid, is not eligible for the savings program. Reference [phone]. Should I call patient to cancel tomorrow's appointment?

## 2020-04-20 ENCOUNTER — Ambulatory Visit: Payer: Self-pay | Admitting: Neurology

## 2020-04-20 NOTE — Telephone Encounter (Signed)
I called patient to explain situation to her today. Advised that there are a few appointments available this week for her to come in and discuss other options with NP. She states she is working this week and prefers to call back when she can.

## 2020-05-13 IMAGING — US US TRANSVAGINAL NON-OB
1 series · 13 of 25 positions shown · non-contrast
Comparison: None.

CLINICAL DATA: Menorrhagia with regular cycle.

EXAM:
ULTRASOUND PELVIS TRANSVAGINAL
TECHNIQUE: Transvaginal ultrasound examination of the pelvis was performed
including evaluation of the uterus, ovaries, adnexal regions, and
pelvic cul-de-sac.

[Series 1: us transvaginal non-ob · 0.14mm/px · 13 of 56 slices shown]
[im 1/56]
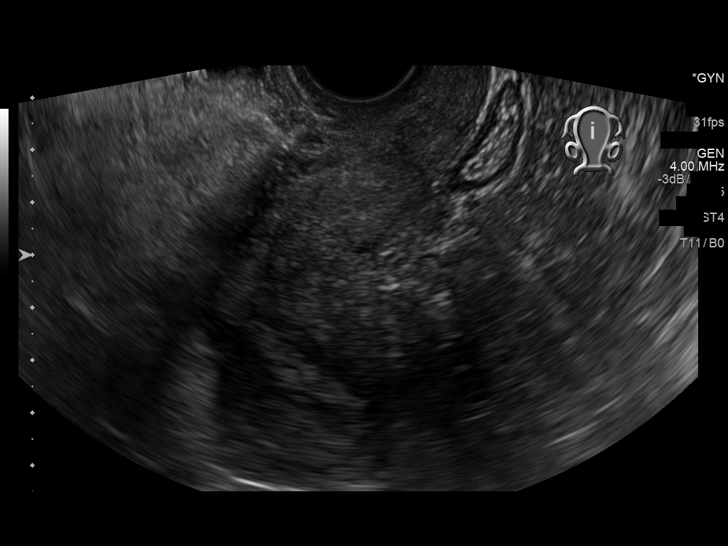
[im 5/56]
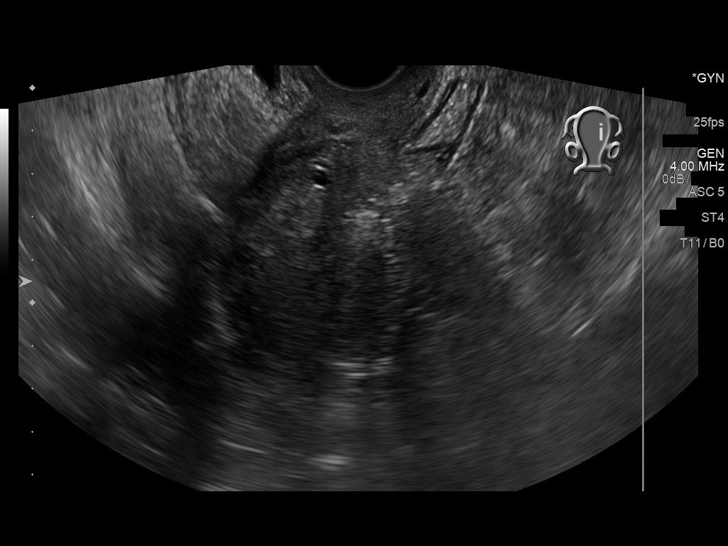
[im 10/56]
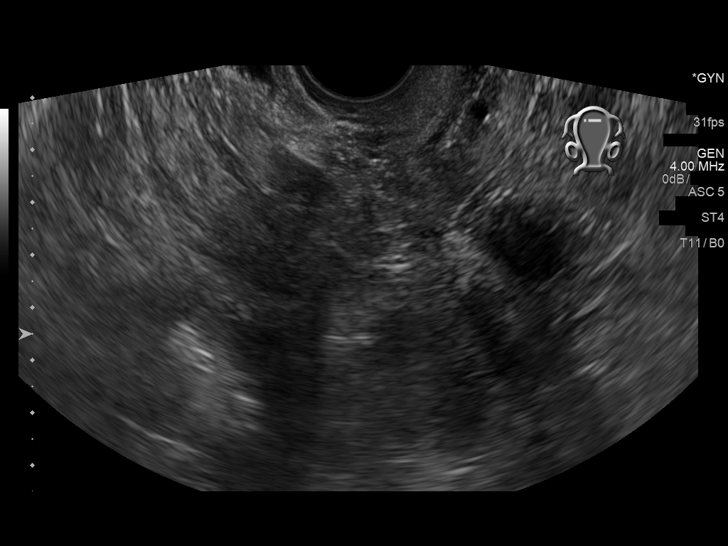
[im 14/56]
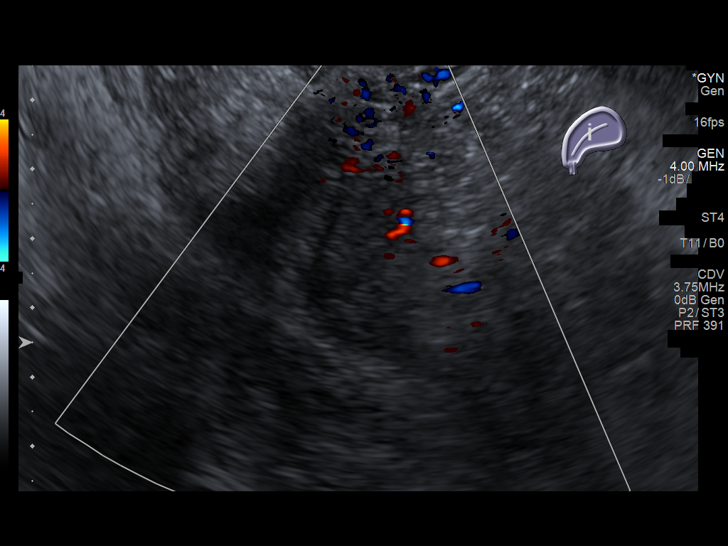
[im 19/56]
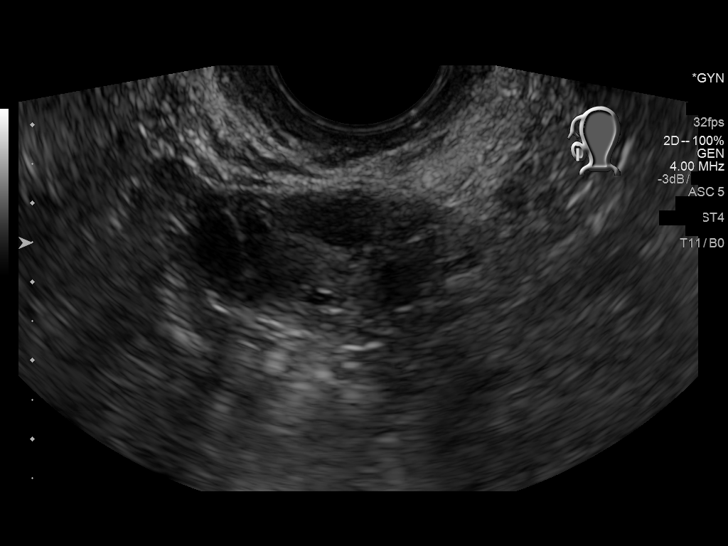
[im 23/56]
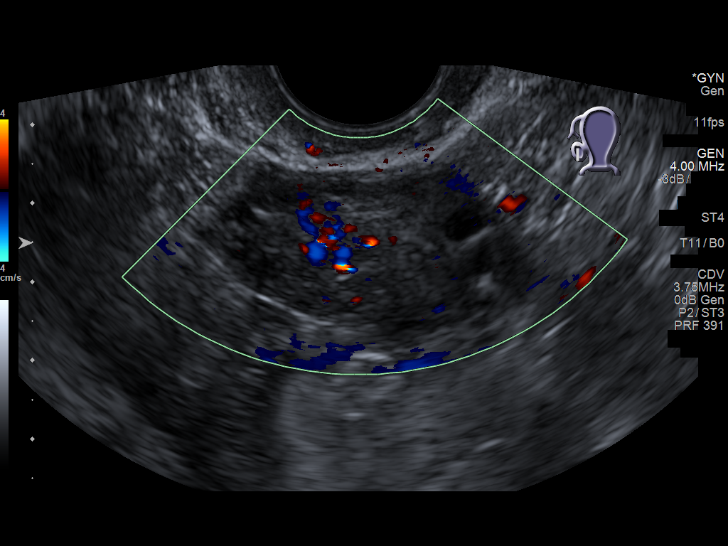
[im 28/56]
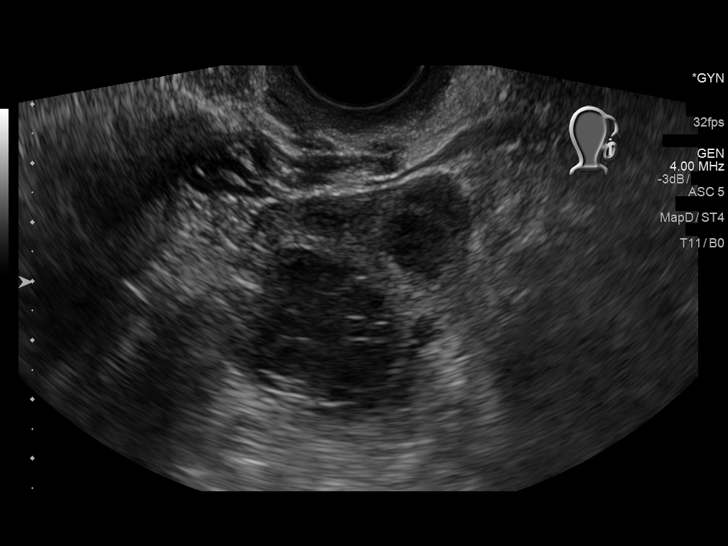
[im 33/56]
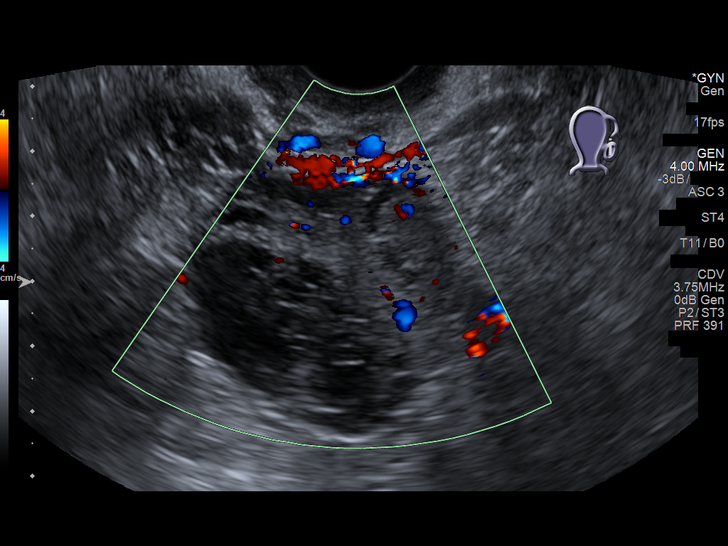
[im 37/56]
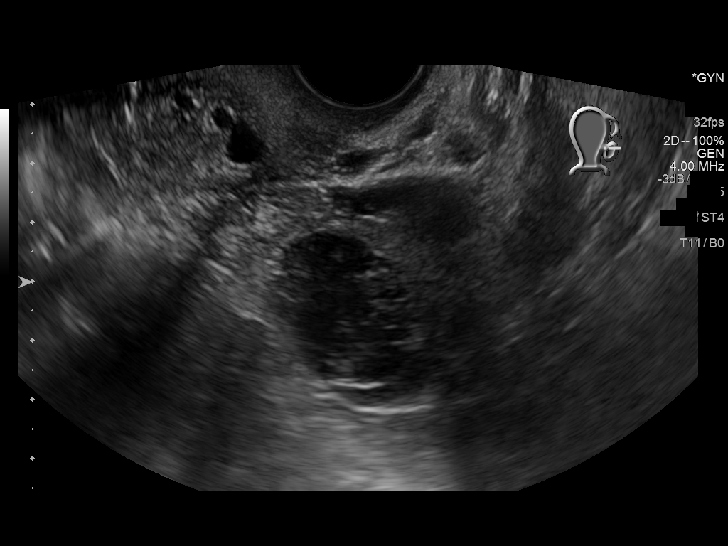
[im 42/56]
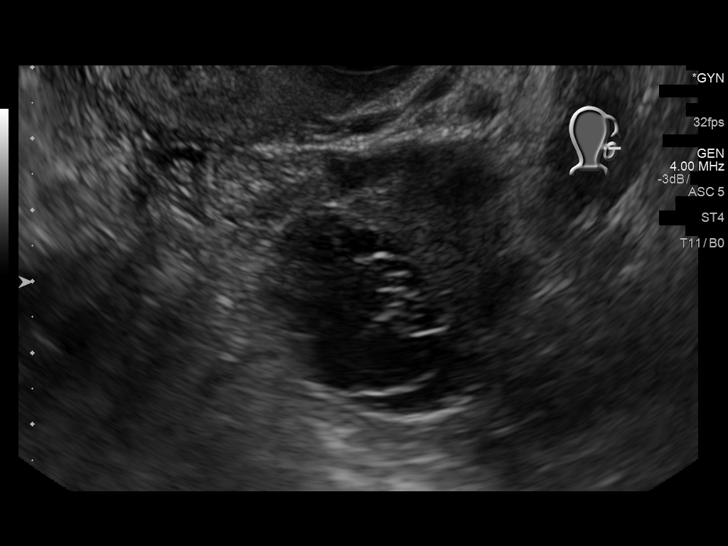
[im 46/56]
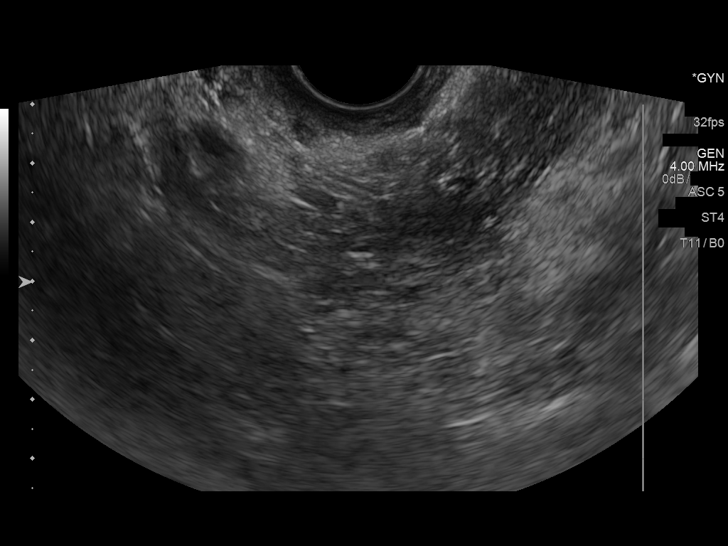
[im 51/56]
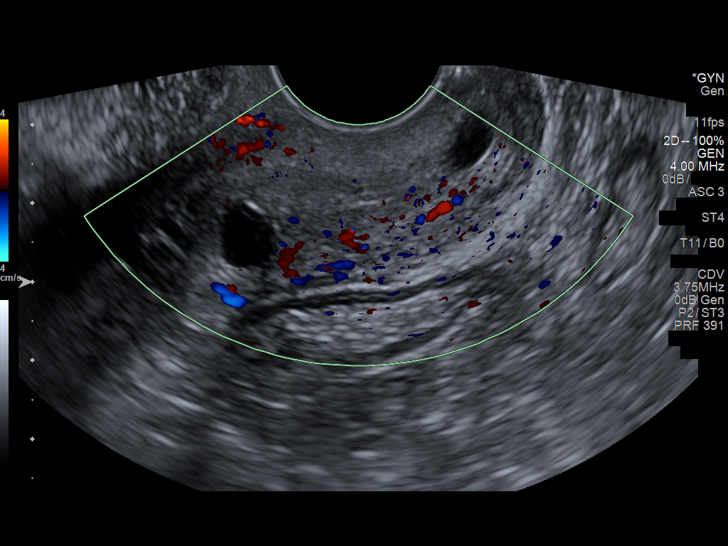
[im 56/56]
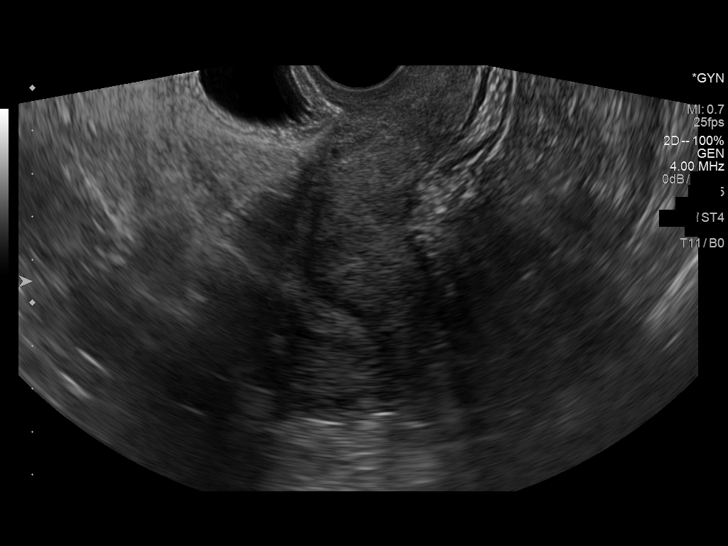

[13 of 25 positions shown; findings below may reference images not displayed]

FINDINGS: Uterus

Measurements: 6.7 x 4.0 x 5.5 cm (volume = 77 cm^3). No fibroids
or other mass visualized.

Endometrium

Thickness: 8 mm.  No focal abnormality visualized.

Right ovary

Measurements: 4.4 x 2.1 x 2.7 cm (volume = 13 cm^3) Multiple
peripheral follicles are identified within the right ovary measuring
up to 8 mm.

Left ovary

Measurements: 3.8 x 4.5 x 4.4 cm (volume = 39 cm^3). Multiple
prominent peripheral follicles identified. There is a dominant cyst
within the left ovary which measures 3.5 x 2.4 x 2.7 cm and is
favored to represent a subacute hemorrhagic cyst.

Other findings:  No abnormal free fluid
IMPRESSION: 1. Sonographic findings compatible with polycystic ovaries.
2. No focal endometrial abnormality identified.
3. Complex left ovary cyst is favored to represent a hemorrhagic
cyst. Short-interval follow up ultrasound in 6-12 weeks is
recommended, preferably during the week following the patient's
normal menses.

## 2023-11-06 NOTE — Progress Notes (Deleted)
 Office Visit Note  Patient: Sandra Fuller             Date of Birth: 07/15/1985           MRN: [id number]             [facility]P: [facility] Referring: Leron Millman, NP Visit Date: 11/20/2023 Occupation: Data Unavailable  Subjective:  No chief complaint on file.   History of Present Illness: Sandra Fuller is a 38 y.o. female ***     Activities of Daily Living:  Patient reports morning stiffness for *** {minute/hour:19697}.   Patient {ACTIONS;DENIES/REPORTS:21021675::Denies} nocturnal pain.  Difficulty dressing/grooming: {ACTIONS;DENIES/REPORTS:21021675::Denies} Difficulty climbing stairs: {ACTIONS;DENIES/REPORTS:21021675::Denies} Difficulty getting out of chair: {ACTIONS;DENIES/REPORTS:21021675::Denies} Difficulty using hands for taps, buttons, cutlery, and/or writing: {ACTIONS;DENIES/REPORTS:21021675::Denies}  No Rheumatology ROS completed.   PMFS History:  Patient Active Problem List   Diagnosis Date Noted   Chronic migraine without aura without status migrainosus, not intractable 06/16/2019   Migraine with aura and without status migrainosus, not intractable 06/16/2019   Adult BMI 30+ 01/27/2015    Past Medical History:  Diagnosis Date   Anxiety    Arthropathy    Chronic pain    Disc degeneration, lumbar    Headache    Migraines   Hypercholesteremia    Hypertension    Low back pain with radiation    Obesity    PCOS (polycystic ovarian syndrome)    Vitamin D  deficiency     Family History  Problem Relation Age of Onset   Hypertension Mother    Diabetes Mother        gestational   Preterm labor Mother    Fibroids Mother    Hypertension Father    Heart disease Father    Cancer - Lung Paternal Grandmother    Diabetes type II Paternal Grandmother    Diabetes type II Maternal Grandmother    ADD / ADHD Daughter    Asthma Daughter    Migraines Neg Hx    Past Surgical History:  Procedure Laterality Date   ARTHROSCOPIC REPAIR ACL  Bilateral    CESAREAN SECTION     CESAREAN SECTION N/A 08/17/2015   Procedure: CESAREAN SECTION;  Surgeon: Lang JINNY Peel, DO;  Location: [facility];  Service: Obstetrics;  Laterality: N/A;   KNEE SURGERY Bilateral    x4; arthroscopic    NOSE SURGERY     broken nose   SHOULDER SURGERY Left 09/2018   TUBAL LIGATION     tied and clipped   WISDOM TOOTH EXTRACTION     Social History   Tobacco Use   Smoking status: Never   Smokeless tobacco: Never  Vaping Use   Vaping status: Never Used  Substance Use Topics   Alcohol use: Yes    Alcohol/week: 1.0 standard drink of alcohol    Types: 1 Standard drinks or equivalent per week    Comment: maybe once a week if that   Drug use: No   Social History   Social History Narrative   Lives at home with husband, mom, and kids   Right handed   Caffeine : at least 3 cups/day (more-so if having a migraine)     Immunization History  Administered Date(s) Administered   Tdap 06/25/2015     Objective: Vital Signs: There were no vitals taken for this visit.   Physical Exam   Musculoskeletal Exam: ***  CDAI Exam: CDAI Score: -- Patient Global: --; Provider Global: -- Swollen: --; Tender: -- Joint Exam 11/20/2023  No joint exam has been documented for this visit   There is currently no information documented on the homunculus. Go to the Rheumatology activity and complete the homunculus joint exam.  Investigation: No additional findings.  Imaging: No results found.  Recent Labs: Lab Results  Component Value Date   WBC 8.6 06/16/2019   HGB 13.7 06/16/2019   PLT 313 06/16/2019   NA 140 06/16/2019   K 4.5 06/16/2019   CL 105 06/16/2019   CO2 19 (L) 06/16/2019   GLUCOSE 94 06/16/2019   BUN 8 06/16/2019   CREATININE 0.59 06/16/2019   BILITOT 0.4 06/16/2019   ALKPHOS 95 06/16/2019   AST 11 06/16/2019   ALT 12 06/16/2019   PROT 6.5 06/16/2019   ALBUMIN 4.0 06/16/2019   CALCIUM  9.3 06/16/2019   GFRAA 138  06/16/2019   April 10, 2023 hemoglobin A1c 5.4, hemoglobin 14.5, CMP creatinine 0.6, ALT 18, AST 17, RF 8, anti-CCP 119, dsDNA negative, RNP negative, SSA negative, SSB 1.5, Smith negative, uric acid 5.7, vitamin D22.21, Speciality Comments: No specialty comments available.  Procedures:  No procedures performed Allergies: Magnesium -containing compounds, Adhesive [tape], Latex, and Penicillins   Assessment / Plan:     Visit Diagnoses: No diagnosis found.  Orders: No orders of the defined types were placed in this encounter.  No orders of the defined types were placed in this encounter.   Face-to-face time spent with patient was *** minutes. Greater than 50% of time was spent in counseling and coordination of care.  Follow-Up Instructions: No follow-ups on file.   Maya Nash, MD  Note - This record has been created using Animal nutritionist.  Chart creation errors have been sought, but may not always  have been located. Such creation errors do not reflect on  the standard of medical care.

## 2023-11-20 ENCOUNTER — Encounter: Admitting: Rheumatology

## 2023-11-20 DIAGNOSIS — E559 Vitamin D deficiency, unspecified: Secondary | ICD-10-CM

## 2023-11-20 DIAGNOSIS — R5383 Other fatigue: Secondary | ICD-10-CM

## 2023-11-20 DIAGNOSIS — G4733 Obstructive sleep apnea (adult) (pediatric): Secondary | ICD-10-CM

## 2023-11-20 DIAGNOSIS — G43709 Chronic migraine without aura, not intractable, without status migrainosus: Secondary | ICD-10-CM

## 2023-11-20 DIAGNOSIS — R7689 Other specified abnormal immunological findings in serum: Secondary | ICD-10-CM

## 2023-11-20 DIAGNOSIS — E538 Deficiency of other specified B group vitamins: Secondary | ICD-10-CM

## 2023-11-20 DIAGNOSIS — E282 Polycystic ovarian syndrome: Secondary | ICD-10-CM

## 2023-11-20 DIAGNOSIS — N946 Dysmenorrhea, unspecified: Secondary | ICD-10-CM

## 2023-11-20 DIAGNOSIS — Z79891 Long term (current) use of opiate analgesic: Secondary | ICD-10-CM

## 2023-11-20 DIAGNOSIS — E785 Hyperlipidemia, unspecified: Secondary | ICD-10-CM

## 2023-11-20 DIAGNOSIS — F419 Anxiety disorder, unspecified: Secondary | ICD-10-CM

## 2023-11-20 DIAGNOSIS — R7681 Abnormal rheumatoid factor and anti-citrullinated protein antibody without rheumatoid arthritis: Secondary | ICD-10-CM

## 2023-11-20 DIAGNOSIS — G894 Chronic pain syndrome: Secondary | ICD-10-CM

## 2023-11-20 DIAGNOSIS — M791 Myalgia, unspecified site: Secondary | ICD-10-CM

## 2023-11-20 DIAGNOSIS — M47816 Spondylosis without myelopathy or radiculopathy, lumbar region: Secondary | ICD-10-CM

## 2023-11-20 DIAGNOSIS — E611 Iron deficiency: Secondary | ICD-10-CM

## 2023-11-20 DIAGNOSIS — G8929 Other chronic pain: Secondary | ICD-10-CM

## 2023-12-20 ENCOUNTER — Ambulatory Visit: Admitting: Rheumatology

## 2024-03-12 ENCOUNTER — Ambulatory Visit: Admitting: Rheumatology

## 2024-04-16 ENCOUNTER — Ambulatory Visit: Admitting: Rheumatology
